# Patient Record
Sex: Female | Born: 1980 | Race: White | Hispanic: No | Marital: Married | Smoking: Current every day smoker
Health system: Southern US, Community
[De-identification: ages and names within clinical notes are randomized; demographics above are authoritative.]

## PROBLEM LIST (undated history)

## (undated) DIAGNOSIS — K819 Cholecystitis, unspecified: Secondary | ICD-10-CM

## (undated) HISTORY — PX: NO PAST SURGERIES: SHX2092

---
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
## 1999-02-11 ENCOUNTER — Observation Stay (HOSPITAL_COMMUNITY): Admission: AD | Admit: 1999-02-11 | Discharge: 1999-02-12 | Payer: Self-pay | Admitting: Obstetrics & Gynecology

## 1999-02-12 ENCOUNTER — Inpatient Hospital Stay (HOSPITAL_COMMUNITY): Admission: AD | Admit: 1999-02-12 | Discharge: 1999-02-12 | Payer: Self-pay | Admitting: Obstetrics and Gynecology

## 1999-02-15 ENCOUNTER — Inpatient Hospital Stay (HOSPITAL_COMMUNITY): Admission: AD | Admit: 1999-02-15 | Discharge: 1999-02-15 | Payer: Self-pay | Admitting: Obstetrics and Gynecology

## 1999-02-20 ENCOUNTER — Inpatient Hospital Stay (HOSPITAL_COMMUNITY): Admission: AD | Admit: 1999-02-20 | Discharge: 1999-02-20 | Payer: Self-pay | Admitting: Obstetrics & Gynecology

## 1999-02-26 ENCOUNTER — Inpatient Hospital Stay (HOSPITAL_COMMUNITY): Admission: AD | Admit: 1999-02-26 | Discharge: 1999-02-28 | Payer: Self-pay | Admitting: Obstetrics and Gynecology

## 1999-08-29 ENCOUNTER — Emergency Department (HOSPITAL_COMMUNITY): Admission: EM | Admit: 1999-08-29 | Discharge: 1999-08-29 | Payer: Self-pay | Admitting: Emergency Medicine

## 1999-12-31 ENCOUNTER — Other Ambulatory Visit: Admission: RE | Admit: 1999-12-31 | Discharge: 1999-12-31 | Payer: Self-pay | Admitting: Obstetrics and Gynecology

## 2001-08-29 ENCOUNTER — Emergency Department (HOSPITAL_COMMUNITY): Admission: EM | Admit: 2001-08-29 | Discharge: 2001-08-29 | Payer: Self-pay | Admitting: Emergency Medicine

## 2001-09-20 ENCOUNTER — Emergency Department (HOSPITAL_COMMUNITY): Admission: EM | Admit: 2001-09-20 | Discharge: 2001-09-20 | Payer: Self-pay | Admitting: *Deleted

## 2001-10-20 ENCOUNTER — Encounter: Payer: Self-pay | Admitting: Family Medicine

## 2001-10-20 ENCOUNTER — Ambulatory Visit (HOSPITAL_COMMUNITY): Admission: RE | Admit: 2001-10-20 | Discharge: 2001-10-20 | Payer: Self-pay | Admitting: Family Medicine

## 2001-12-26 ENCOUNTER — Other Ambulatory Visit: Admission: RE | Admit: 2001-12-26 | Discharge: 2001-12-26 | Payer: Self-pay | Admitting: Obstetrics and Gynecology

## 2002-01-18 ENCOUNTER — Emergency Department (HOSPITAL_COMMUNITY): Admission: EM | Admit: 2002-01-18 | Discharge: 2002-01-18 | Payer: Self-pay | Admitting: Internal Medicine

## 2002-01-19 ENCOUNTER — Inpatient Hospital Stay (HOSPITAL_COMMUNITY): Admission: AD | Admit: 2002-01-19 | Discharge: 2002-01-19 | Payer: Self-pay | Admitting: *Deleted

## 2002-06-11 ENCOUNTER — Emergency Department (HOSPITAL_COMMUNITY): Admission: EM | Admit: 2002-06-11 | Discharge: 2002-06-11 | Payer: Self-pay | Admitting: Emergency Medicine

## 2002-06-26 ENCOUNTER — Emergency Department (HOSPITAL_COMMUNITY): Admission: EM | Admit: 2002-06-26 | Discharge: 2002-06-26 | Payer: Self-pay | Admitting: *Deleted

## 2002-06-26 ENCOUNTER — Encounter: Payer: Self-pay | Admitting: *Deleted

## 2003-08-02 ENCOUNTER — Ambulatory Visit (HOSPITAL_COMMUNITY): Admission: RE | Admit: 2003-08-02 | Discharge: 2003-08-02 | Payer: Self-pay | Admitting: Family Medicine

## 2003-10-18 ENCOUNTER — Ambulatory Visit (HOSPITAL_COMMUNITY): Admission: RE | Admit: 2003-10-18 | Discharge: 2003-10-18 | Payer: Self-pay | Admitting: Gynecology

## 2004-03-10 ENCOUNTER — Inpatient Hospital Stay (HOSPITAL_COMMUNITY): Admission: AD | Admit: 2004-03-10 | Discharge: 2004-03-12 | Payer: Self-pay | Admitting: Gynecology

## 2004-06-30 ENCOUNTER — Emergency Department: Payer: Self-pay | Admitting: Emergency Medicine

## 2005-06-07 ENCOUNTER — Emergency Department: Payer: Self-pay | Admitting: Emergency Medicine

## 2006-08-29 ENCOUNTER — Encounter: Payer: Self-pay | Admitting: Maternal & Fetal Medicine

## 2006-11-03 ENCOUNTER — Observation Stay: Payer: Self-pay | Admitting: Obstetrics and Gynecology

## 2006-11-07 ENCOUNTER — Encounter: Payer: Self-pay | Admitting: Maternal & Fetal Medicine

## 2006-12-29 ENCOUNTER — Encounter: Payer: Self-pay | Admitting: Maternal & Fetal Medicine

## 2007-01-02 ENCOUNTER — Observation Stay: Payer: Self-pay

## 2007-01-02 ENCOUNTER — Encounter: Payer: Self-pay | Admitting: Maternal & Fetal Medicine

## 2007-01-05 ENCOUNTER — Encounter: Payer: Self-pay | Admitting: Maternal & Fetal Medicine

## 2007-01-11 ENCOUNTER — Inpatient Hospital Stay: Payer: Self-pay

## 2007-04-23 ENCOUNTER — Emergency Department: Payer: Self-pay | Admitting: Emergency Medicine

## 2008-09-23 ENCOUNTER — Ambulatory Visit (HOSPITAL_COMMUNITY): Admission: RE | Admit: 2008-09-23 | Discharge: 2008-09-23 | Payer: Self-pay | Admitting: Obstetrics & Gynecology

## 2008-10-23 ENCOUNTER — Inpatient Hospital Stay (HOSPITAL_COMMUNITY): Admission: AD | Admit: 2008-10-23 | Discharge: 2008-10-23 | Payer: Self-pay | Admitting: Obstetrics

## 2008-10-24 ENCOUNTER — Inpatient Hospital Stay (HOSPITAL_COMMUNITY): Admission: AD | Admit: 2008-10-24 | Discharge: 2008-10-25 | Payer: Self-pay | Admitting: Obstetrics

## 2009-04-15 ENCOUNTER — Emergency Department (HOSPITAL_COMMUNITY): Admission: EM | Admit: 2009-04-15 | Discharge: 2009-04-15 | Payer: Self-pay | Admitting: Emergency Medicine

## 2010-01-01 ENCOUNTER — Ambulatory Visit: Payer: Self-pay | Admitting: Nurse Practitioner

## 2010-01-01 ENCOUNTER — Inpatient Hospital Stay (HOSPITAL_COMMUNITY): Admission: AD | Admit: 2010-01-01 | Discharge: 2010-01-01 | Payer: Self-pay | Admitting: Obstetrics

## 2010-04-02 ENCOUNTER — Emergency Department (HOSPITAL_COMMUNITY): Admission: EM | Admit: 2010-04-02 | Discharge: 2010-04-02 | Payer: Self-pay | Admitting: Emergency Medicine

## 2010-05-19 IMAGING — CT CT PELVIS W/ CM
2 of 4 series · 17 of 46 positions shown, 19 images · IV contrast (APPLIED)
Comparison: None

CT ABDOMEN

CLINICAL DATA: Right lower quadrant pain.

CT ABDOMEN AND PELVIS WITH CONTRAST
TECHNIQUE: Multidetector CT imaging of the abdomen and pelvis was
performed using the standard protocol following bolus
administration of intravenous contrast.
Contrast: 80 ml Kmnipaque-DGG and oral contrast

[Series 2: abd/pelv with 5.0 b31f st · axial · 0.60mm/px · z∈[-423,-43]mm · 14 of 84 slices shown, 16 images]
[im 4/84  soft-tissue]
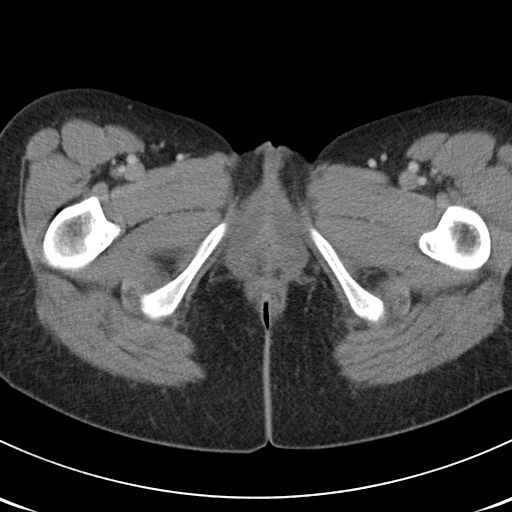
[im 4/84  bone]
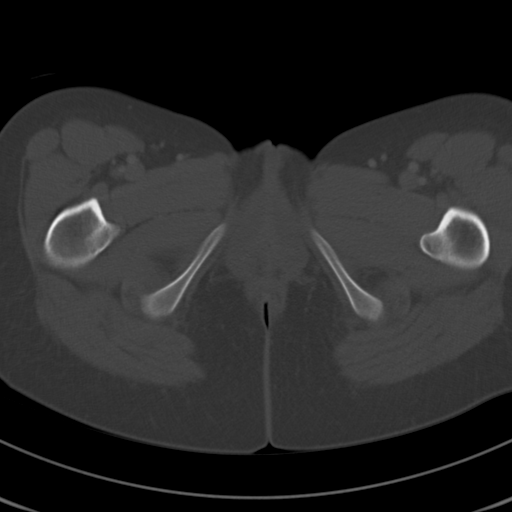
[im 11/84  soft-tissue]
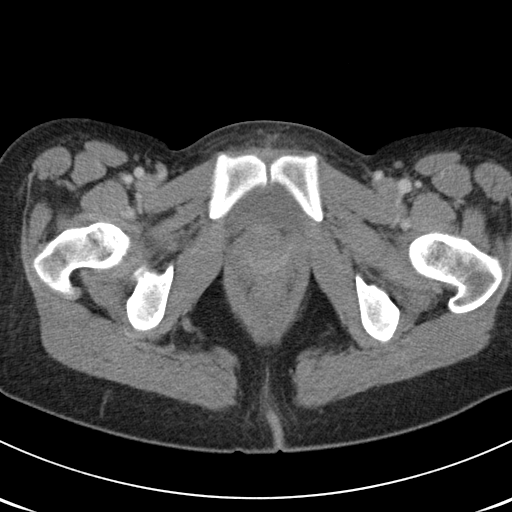
[im 18/84  soft-tissue]
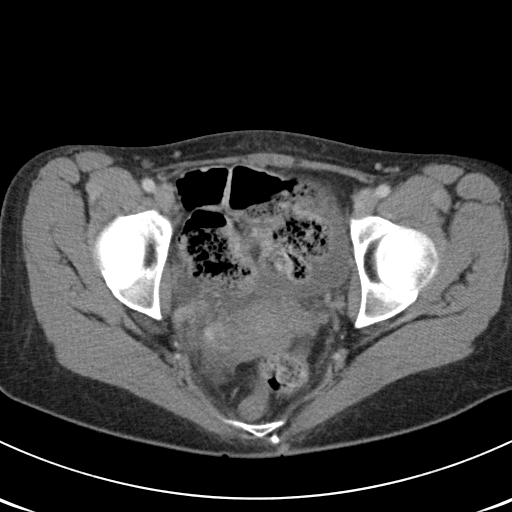
[im 21/84  soft-tissue]
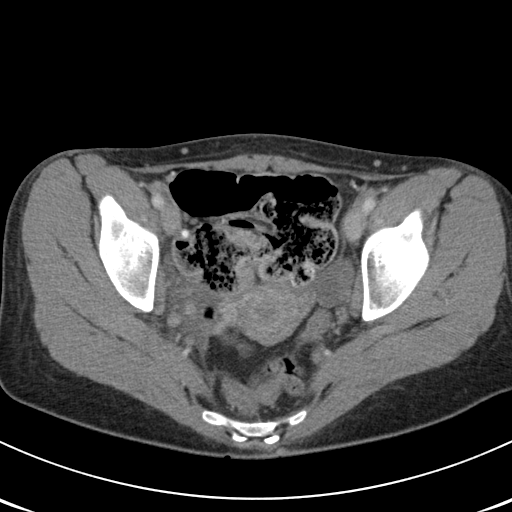
[im 28/84  soft-tissue]
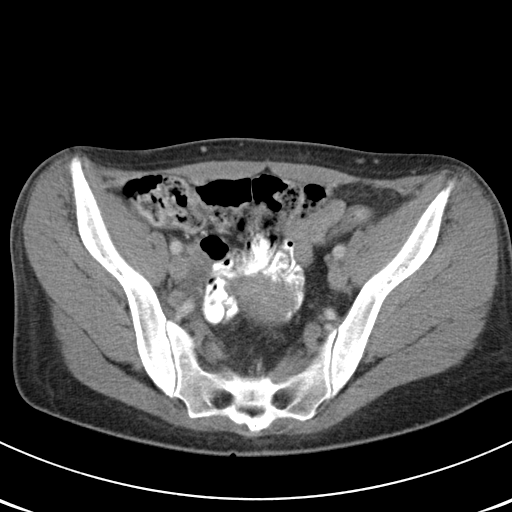
[im 35/84  soft-tissue]
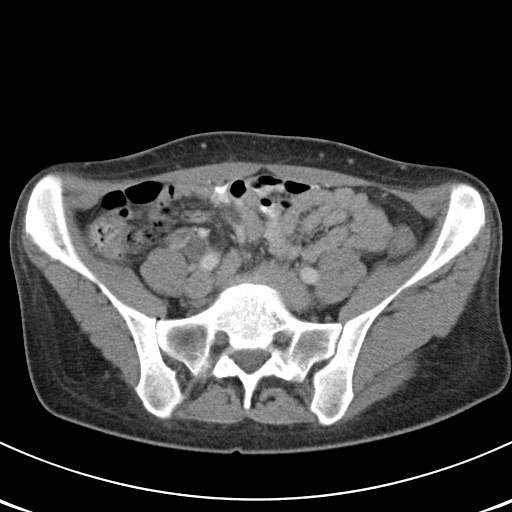
[im 39/84  soft-tissue]
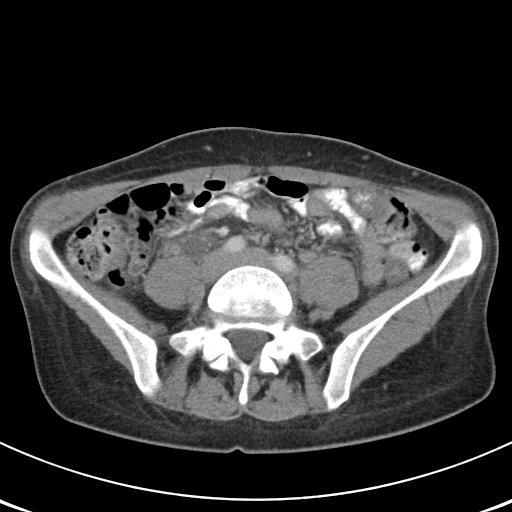
[im 45/84  soft-tissue]
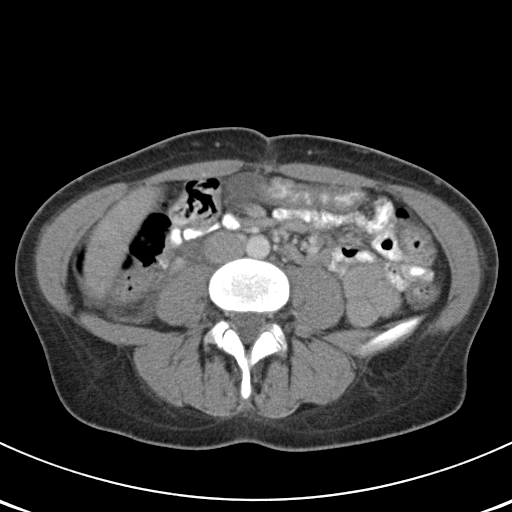
[im 49/84  soft-tissue]
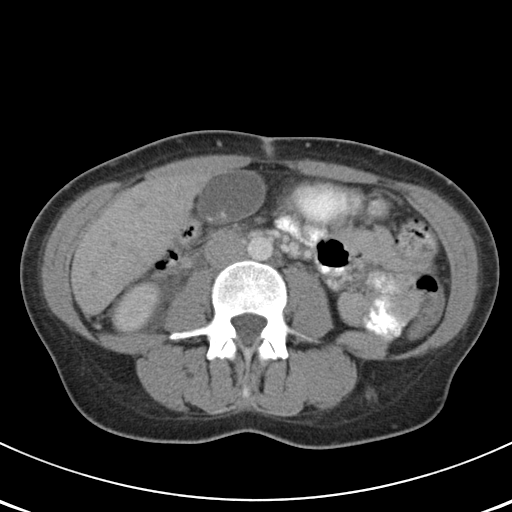
[im 49/84  bone]
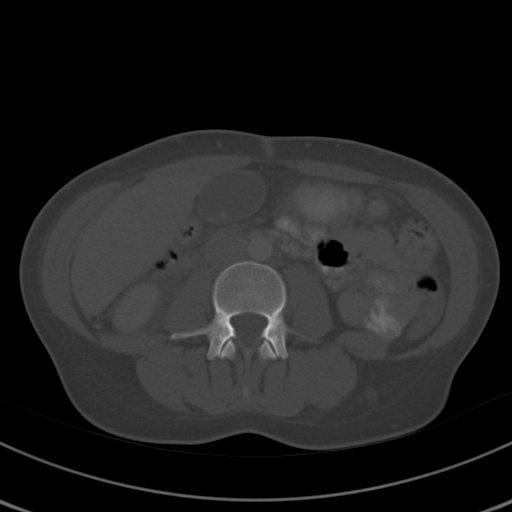
[im 56/84  soft-tissue]
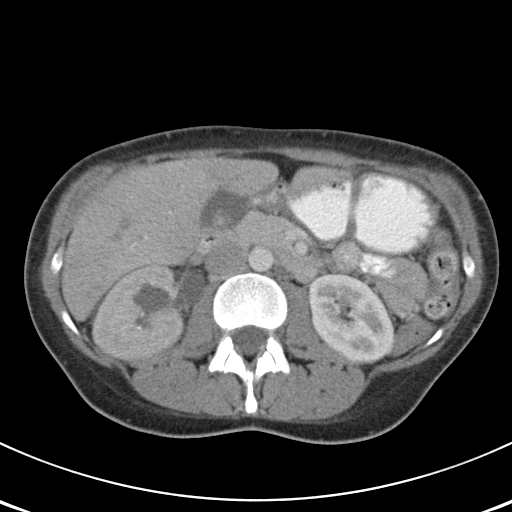
[im 63/84  soft-tissue]
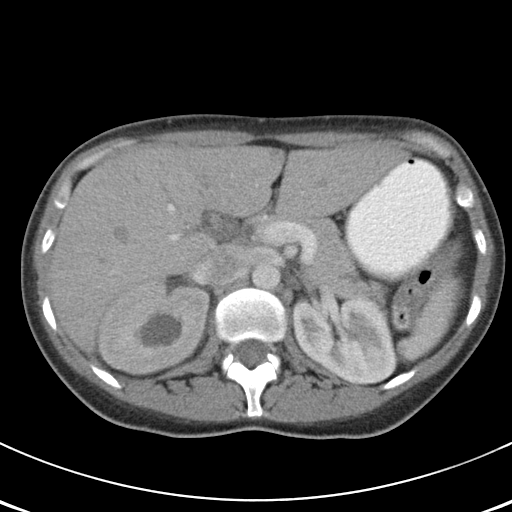
[im 66/84  soft-tissue]
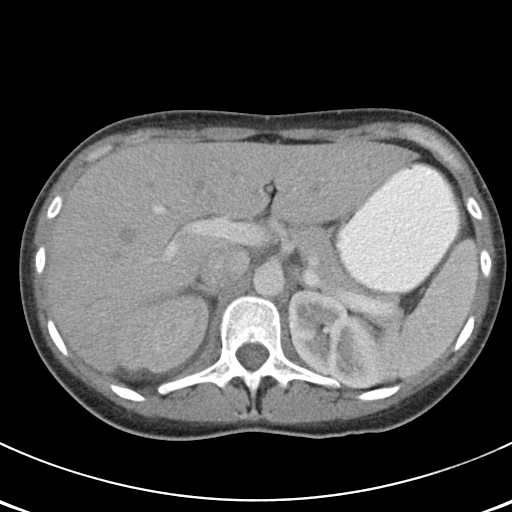
[im 73/84  soft-tissue]
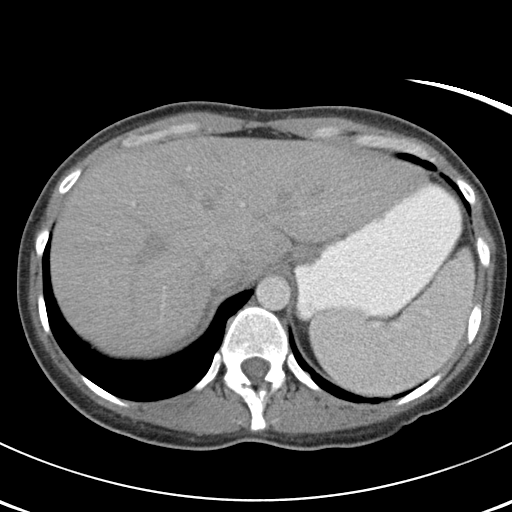
[im 80/84  soft-tissue]
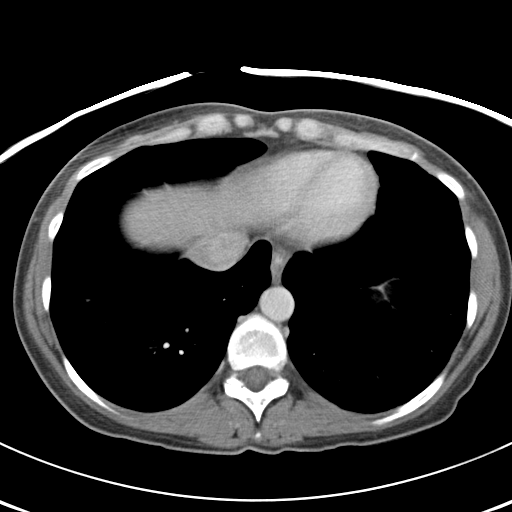

[Series 602: coronal a/p · coronal · 0.82mm/px · 3 of 55 slices shown]
[im 19/55  soft-tissue]
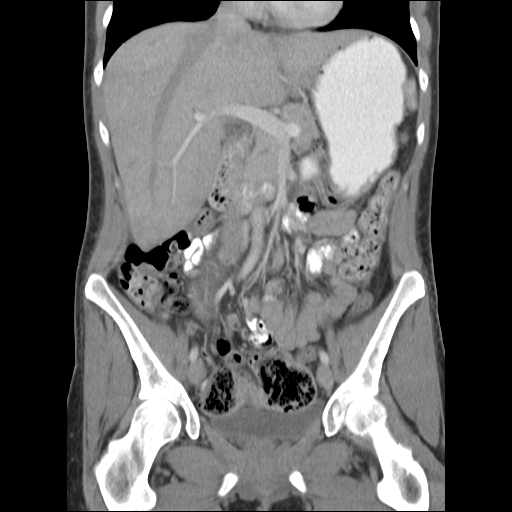
[im 25/55  soft-tissue]
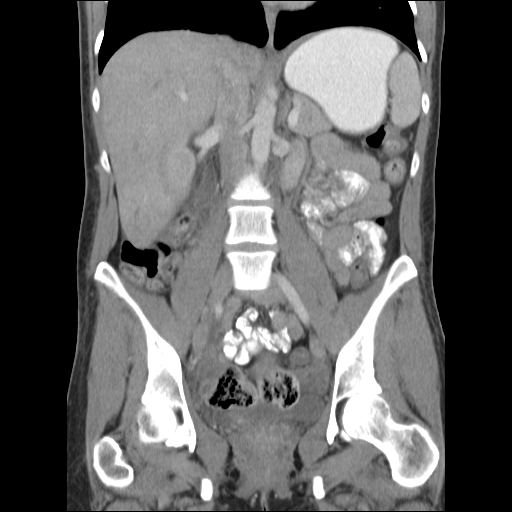
[im 31/55  soft-tissue]
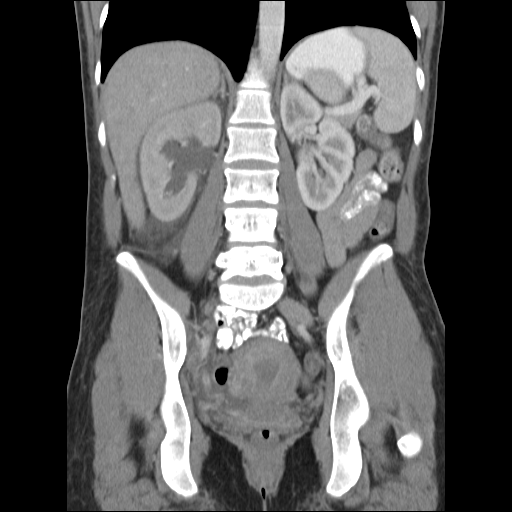

[17 of 46 positions shown; findings below may reference images not displayed]

FINDINGS: Moderate right hydronephrosis and ureterectasis is seen
with delayed contrast excretion by the right kidney.  Several tiny
1-2 mm right intrarenal calculi also noted. No evidence of left-
sided hydronephrosis.  No evidence of renal mass.

Small calcified gallstones are noted however there is no evidence
of acute cholecystitis.  No other inflammatory process or abnormal
fluid collections are identified within the abdomen.  The other
abdominal parenchymal organs are normal in appearance, and there is
no evidence of soft tissue masses or lymphadenopathy.
IMPRESSION: 1.  Moderate right hydronephrosis and ureterectasis.  See abnormal
pelvis CT report below.
2.  Right nephrolithiasis and cholelithiasis.

CT PELVIS
FINDINGS: Moderate dilatation of the right ureter is seen.  A
calculus is seen at the right ureterovesicle junction measuring
approximately 3 x 5 mm.

No pelvic soft tissue masses are identified.  There is no evidence
of inflammatory process or abnormal fluid collections within the
pelvis.  No evidence of dilated bowel loops.
IMPRESSION: 3 x 5 mm calculus at the right ureterovesicle junction, causing
moderate right hydroureteronephrosis.

## 2010-06-15 ENCOUNTER — Encounter: Payer: Self-pay | Admitting: Obstetrics & Gynecology

## 2010-08-04 LAB — URINALYSIS, ROUTINE W REFLEX MICROSCOPIC
Hgb urine dipstick: NEGATIVE
Nitrite: NEGATIVE
Protein, ur: NEGATIVE mg/dL
Urobilinogen, UA: 0.2 mg/dL (ref 0.0–1.0)

## 2010-08-04 LAB — URINE CULTURE
Colony Count: NO GROWTH
Culture  Setup Time: 201111110337
Culture: NO GROWTH

## 2010-08-04 LAB — URINE MICROSCOPIC-ADD ON

## 2010-08-04 LAB — POCT PREGNANCY, URINE: Preg Test, Ur: NEGATIVE

## 2010-08-07 LAB — URINALYSIS, ROUTINE W REFLEX MICROSCOPIC
Glucose, UA: NEGATIVE mg/dL
Nitrite: NEGATIVE
Protein, ur: NEGATIVE mg/dL
Specific Gravity, Urine: 1.025 (ref 1.005–1.030)

## 2010-08-26 LAB — COMPREHENSIVE METABOLIC PANEL
CO2: 17 mEq/L — ABNORMAL LOW (ref 19–32)
Calcium: 9.8 mg/dL (ref 8.4–10.5)
Creatinine, Ser: 0.9 mg/dL (ref 0.4–1.2)
GFR calc Af Amer: >60 mL/min (ref >60)
Glucose, Bld: 98 mg/dL (ref 70–99)

## 2010-08-26 LAB — POCT I-STAT, CHEM 8
BUN: 5 mg/dL — ABNORMAL LOW (ref 6–23)
Calcium, Ion: 1.14 mmol/L (ref 1.12–1.32)
Creatinine, Ser: 0.9 mg/dL (ref 0.4–1.2)
Glucose, Bld: 95 mg/dL (ref 70–99)
Sodium: 141 mEq/L (ref 135–145)
TCO2: 16 mmol/L (ref 0–100)

## 2010-08-26 LAB — CBC
HCT: 43.1 % (ref 36.0–46.0)
MCHC: 35 g/dL (ref 30.0–36.0)
MCV: 88.4 fL (ref 78.0–100.0)
RBC: 4.88 MIL/uL (ref 3.87–5.11)

## 2010-08-26 LAB — DIFFERENTIAL
Eosinophils Relative: 0 % (ref 0–5)
Lymphocytes Relative: 10 % — ABNORMAL LOW (ref 12–46)
Monocytes Absolute: 0.6 10*3/uL (ref 0.1–1.0)
Monocytes Relative: 6 % (ref 3–12)
Neutro Abs: 8 10*3/uL — ABNORMAL HIGH (ref 1.7–7.7)
Neutrophils Relative %: 83 % — ABNORMAL HIGH (ref 43–77)

## 2010-08-26 LAB — URINALYSIS, ROUTINE W REFLEX MICROSCOPIC
Protein, ur: NEGATIVE mg/dL
Urobilinogen, UA: 0.2 mg/dL (ref 0.0–1.0)

## 2010-08-26 LAB — URINE MICROSCOPIC-ADD ON

## 2010-08-31 LAB — CBC
HCT: 33 % — ABNORMAL LOW (ref 36.0–46.0)
MCHC: 35.2 g/dL (ref 30.0–36.0)
MCV: 84.8 fL (ref 78.0–100.0)
Platelets: 336 10*3/uL (ref 150–400)
Platelets: 384 10*3/uL (ref 150–400)
RBC: 4.06 MIL/uL (ref 3.87–5.11)
RDW: 14.2 % (ref 11.5–15.5)
WBC: 13 10*3/uL — ABNORMAL HIGH (ref 4.0–10.5)

## 2010-08-31 LAB — GLUCOSE, CAPILLARY: Glucose-Capillary: 66 mg/dL — ABNORMAL LOW (ref 70–99)

## 2014-01-04 ENCOUNTER — Other Ambulatory Visit (HOSPITAL_COMMUNITY)
Admission: RE | Admit: 2014-01-04 | Discharge: 2014-01-04 | Disposition: A | Discharge status: Home / Self Care | Payer: Managed Care, Other (non HMO) | Source: Ambulatory Visit | Attending: Obstetrics and Gynecology | Admitting: Obstetrics and Gynecology

## 2014-01-04 ENCOUNTER — Other Ambulatory Visit: Payer: Self-pay | Admitting: Obstetrics and Gynecology

## 2014-01-04 DIAGNOSIS — Z01419 Encounter for gynecological examination (general) (routine) without abnormal findings: Secondary | ICD-10-CM | POA: Insufficient documentation

## 2014-01-04 DIAGNOSIS — Z1151 Encounter for screening for human papillomavirus (HPV): Secondary | ICD-10-CM | POA: Insufficient documentation

## 2014-01-07 LAB — CYTOLOGY - PAP

## 2016-03-19 ENCOUNTER — Encounter (HOSPITAL_COMMUNITY): Payer: Self-pay | Admitting: *Deleted

## 2016-03-19 ENCOUNTER — Emergency Department (HOSPITAL_COMMUNITY)
Admission: EM | Admit: 2016-03-19 | Discharge: 2016-03-19 | Disposition: A | Discharge status: Home / Self Care | Payer: Managed Care, Other (non HMO) | Attending: Emergency Medicine | Admitting: Emergency Medicine

## 2016-03-19 DIAGNOSIS — Z79899 Other long term (current) drug therapy: Secondary | ICD-10-CM | POA: Insufficient documentation

## 2016-03-19 DIAGNOSIS — F17210 Nicotine dependence, cigarettes, uncomplicated: Secondary | ICD-10-CM | POA: Insufficient documentation

## 2016-03-19 DIAGNOSIS — K529 Noninfective gastroenteritis and colitis, unspecified: Secondary | ICD-10-CM | POA: Insufficient documentation

## 2016-03-19 DIAGNOSIS — R1084 Generalized abdominal pain: Secondary | ICD-10-CM

## 2016-03-19 DIAGNOSIS — R112 Nausea with vomiting, unspecified: Secondary | ICD-10-CM

## 2016-03-19 LAB — COMPREHENSIVE METABOLIC PANEL
ALBUMIN: 4.5 g/dL (ref 3.5–5.0)
ALT: 15 U/L (ref 14–54)
AST: 25 U/L (ref 15–41)
Alkaline Phosphatase: 46 U/L (ref 38–126)
Anion gap: 9 (ref 5–15)
BILIRUBIN TOTAL: 1.2 mg/dL (ref 0.3–1.2)
BUN: 11 mg/dL (ref 6–20)
CHLORIDE: 106 mmol/L (ref 101–111)
CO2: 25 mmol/L (ref 22–32)
CREATININE: 0.57 mg/dL (ref 0.44–1.00)
Calcium: 9.4 mg/dL (ref 8.9–10.3)
GFR calc Af Amer: >60 mL/min (ref >60)
GFR calc non Af Amer: >60 mL/min (ref >60)
GLUCOSE: 119 mg/dL — AB (ref 65–99)
POTASSIUM: 4 mmol/L (ref 3.5–5.1)
Sodium: 140 mmol/L (ref 135–145)
TOTAL PROTEIN: 7.4 g/dL (ref 6.5–8.1)

## 2016-03-19 LAB — URINALYSIS, ROUTINE W REFLEX MICROSCOPIC
BILIRUBIN URINE: NEGATIVE
GLUCOSE, UA: NEGATIVE mg/dL
HGB URINE DIPSTICK: NEGATIVE
KETONES UR: 40 mg/dL — AB
Leukocytes, UA: NEGATIVE
Nitrite: NEGATIVE
PH: 6.5 (ref 5.0–8.0)
Protein, ur: NEGATIVE mg/dL
Specific Gravity, Urine: 1.027 (ref 1.005–1.030)

## 2016-03-19 LAB — CBC
HEMATOCRIT: 44.6 % (ref 36.0–46.0)
Hemoglobin: 15.6 g/dL — ABNORMAL HIGH (ref 12.0–15.0)
MCH: 30.8 pg (ref 26.0–34.0)
MCHC: 35 g/dL (ref 30.0–36.0)
MCV: 88.1 fL (ref 78.0–100.0)
PLATELETS: 250 10*3/uL (ref 150–400)
RBC: 5.06 MIL/uL (ref 3.87–5.11)
RDW: 12.5 % (ref 11.5–15.5)
WBC: 8.9 10*3/uL (ref 4.0–10.5)

## 2016-03-19 LAB — I-STAT BETA HCG BLOOD, ED (MC, WL, AP ONLY): I-stat hCG, quantitative: <5 m[IU]/mL (ref <5)

## 2016-03-19 LAB — LIPASE, BLOOD: Lipase: 22 U/L (ref 11–51)

## 2016-03-19 MED ORDER — ONDANSETRON HCL 4 MG/2ML IJ SOLN
4.0000 mg | Freq: Once | INTRAMUSCULAR | Status: AC
  Administered 2016-03-19: 4 mg via INTRAVENOUS
  Filled 2016-03-19: qty 2

## 2016-03-19 MED ORDER — SODIUM CHLORIDE 0.9 % IV BOLUS (SEPSIS)
1000.0000 mL | Freq: Once | INTRAVENOUS | Status: AC
  Administered 2016-03-19: 1000 mL via INTRAVENOUS

## 2016-03-19 MED ORDER — GLYCOPYRROLATE 0.2 MG/ML IJ SOLN
0.1000 mg | Freq: Once | INTRAMUSCULAR | Status: AC
  Administered 2016-03-19: 0.1 mg via INTRAVENOUS
  Filled 2016-03-19: qty 1

## 2016-03-19 MED ORDER — ONDANSETRON HCL 4 MG PO TABS: 4.0000 mg | ORAL_TABLET | Freq: Four times a day (QID) | ORAL | 0 refills | Status: AC

## 2016-03-19 MED ORDER — DICYCLOMINE HCL 20 MG PO TABS: 20.0000 mg | ORAL_TABLET | Freq: Two times a day (BID) | ORAL | 0 refills | Status: AC

## 2016-03-19 MED ORDER — ONDANSETRON 4 MG PO TBDP
4.0000 mg | ORAL_TABLET | Freq: Once | ORAL | Status: AC | PRN
  Administered 2016-03-19: 4 mg via ORAL
  Filled 2016-03-19: qty 1

## 2016-03-19 NOTE — ED Triage Notes (Signed)
Pt reports mid abdominal pain and cramping with nausea and vomiting since early this morning, denies, diarrhea, fever

## 2016-03-19 NOTE — ED Notes (Signed)
Pt called to ask for urine specimen and update vital signs with no answer

## 2016-03-19 NOTE — ED Notes (Signed)
Patient refused to have Iv started at this time. Patient reports that she is currently not having nausea since receiving nausea medicine in triage. Patient stated the nausea medicine made her so sleepy that she missed her turn coming back to the assigned room.

## 2016-03-19 NOTE — Discharge Instructions (Signed)
Clear liquids tonight only. Slowly advance your diet as tolerated.  Bentyl for cramps.  Zofran for nausea.

## 2016-03-19 NOTE — ED Provider Notes (Signed)
[facility]-EMERGENCY DEPT Provider Note   CSN: [id number] Arrival date & time: 03/19/16  1303     History   Chief Complaint Chief Complaint  Patient presents with  . Abdominal Pain  . Emesis    HPI Amanda Brandt is a 35 y.o. female.  Chief complaint diarrhea and abdominal cramping. Patient states she was in her normal state of health last night. She tender. Shortly thereafter started having nausea vomiting and abdominal cramping. Vomited several times during the night at multiple times during the day today. Feels weak and tired. Not frankly dizzy or orthostatic. Continue with abdominal cramping. No diarrhea. No dysuria or GU complaints. No pulmonary complaints or difficulty breathing. No significant past medical history.  HPI  History reviewed. No pertinent past medical history.  There are no active problems to display for this patient.   History reviewed. No pertinent surgical history.  OB History    Gravida Para Term Preterm AB Living   1             SAB TAB Ectopic Multiple Live Births                   Home Medications    Prior to Admission medications   Medication Sig Start Date End Date Taking? Authorizing Provider  ibuprofen (ADVIL,MOTRIN) 200 MG tablet Take 800 mg by mouth every 6 (six) hours as needed for headache or moderate pain.   Yes Historical Provider, MD  Multiple Vitamins-Minerals (MULTIVITAMIN ADULT) TABS Take 1 tablet by mouth daily.   Yes Historical Provider, MD  dicyclomine (BENTYL) 20 MG tablet Take 1 tablet (20 mg total) by mouth 2 (two) times daily. 03/19/16   Rolland Porter, MD  ondansetron (ZOFRAN) 4 MG tablet Take 1 tablet (4 mg total) by mouth every 6 (six) hours. 03/19/16   Rolland Porter, MD    Family History No family history on file.  Social History Social History  Substance Use Topics  . Smoking status: Current Every Day Smoker    Packs/day: 1.00    Types: Cigarettes  . Smokeless tobacco: Never Used  . Alcohol use No     Allergies     Review of patient's allergies indicates no known allergies.   Review of Systems Review of Systems  Constitutional: Negative for appetite change, chills, diaphoresis, fatigue and fever.  HENT: Negative for mouth sores, sore throat and trouble swallowing.   Eyes: Negative for visual disturbance.  Respiratory: Negative for cough, chest tightness, shortness of breath and wheezing.   Cardiovascular: Negative for chest pain.  Gastrointestinal: Positive for abdominal pain, diarrhea, nausea and vomiting. Negative for abdominal distention.  Endocrine: Negative for polydipsia, polyphagia and polyuria.  Genitourinary: Negative for dysuria, frequency and hematuria.  Musculoskeletal: Negative for gait problem.  Skin: Negative for color change, pallor and rash.  Neurological: Negative for dizziness, syncope, light-headedness and headaches.  Hematological: Does not bruise/bleed easily.  Psychiatric/Behavioral: Negative for behavioral problems and confusion.     Physical Exam Updated Vital Signs BP 114/90 (BP Location: Left Arm)   Pulse 77   Temp 98.2 F (36.8 C) (Oral)   Resp 18   Ht 5\' 5"  (1.651 m)   Wt 120 lb (54.4 kg)   LMP 02/23/2016   SpO2 99%   BMI 19.97 kg/m   Physical Exam  Constitutional: She is oriented to person, place, and time. She appears well-developed and well-nourished. No distress.  HENT:  Head: Normocephalic.  Eyes: Conjunctivae are normal. Pupils are equal, round, and reactive  to light. No scleral icterus.  Neck: Normal range of motion. Neck supple. No thyromegaly present.  Cardiovascular: Normal rate and regular rhythm.  Exam reveals no gallop and no friction rub.   No murmur heard. Pulmonary/Chest: Effort normal and breath sounds normal. No respiratory distress. She has no wheezes. She has no rales.  Abdominal: Soft. Bowel sounds are normal. She exhibits no distension. There is no tenderness. There is no rebound.  Benign abdominal exam. Normal active to slightly  hyperactive bowel sounds. No focal tenderness.  Musculoskeletal: Normal range of motion.  Neurological: She is alert and oriented to person, place, and time.  Skin: Skin is warm and dry. No rash noted.  Psychiatric: She has a normal mood and affect. Her behavior is normal.     ED Treatments / Results  Labs (all labs ordered are listed, but only abnormal results are displayed) Labs Reviewed  COMPREHENSIVE METABOLIC PANEL - Abnormal; Notable for the following:       Result Value   Glucose, Bld 119 (*)    All other components within normal limits  CBC - Abnormal; Notable for the following:    Hemoglobin 15.6 (*)    All other components within normal limits  URINALYSIS, ROUTINE W REFLEX MICROSCOPIC (NOT AT [facility]) - Abnormal; Notable for the following:    Color, Urine AMBER (*)    Ketones, ur 40 (*)    All other components within normal limits  LIPASE, BLOOD  I-STAT BETA HCG BLOOD, ED ([facility], [facility], [facility] ONLY)    EKG  EKG Interpretation None       Radiology No results found.  Procedures Procedures (including critical care time)  Medications Ordered in ED Medications  ondansetron (ZOFRAN-ODT) disintegrating tablet 4 mg (4 mg Oral Given 03/19/16 1323)  ondansetron (ZOFRAN) injection 4 mg (4 mg Intravenous Given 03/19/16 1712)  glycopyrrolate (ROBINUL) injection 0.1 mg (0.1 mg Intravenous Given 03/19/16 1707)  sodium chloride 0.9 % bolus 1,000 mL (1,000 mLs Intravenous New Bag/Given 03/19/16 1708)     Initial Impression / Assessment and Plan / ED Course  I have reviewed the triage vital signs and the nursing notes.  Pertinent labs & imaging results that were available during my care of the patient were reviewed by me and considered in my medical decision making (see chart for details).  Clinical Course    Reassuring labs. Given IV fluids, Robinul for cramps, Zofran for nausea. Taking by mouth liquids. Plan discharged home. No focal tenderness to suggest supportive bacterial  infection, appendicitis, diverticulitis. Symptoms consistent with viral process versus food poisoning. Return if worsening symptoms including fever, worsening or localizing pain, bloody stools, other changes.  Final Clinical Impressions(s) / ED Diagnoses   Final diagnoses:  Non-intractable vomiting with nausea, unspecified vomiting type  Generalized abdominal pain  Gastroenteritis    New Prescriptions New Prescriptions   DICYCLOMINE (BENTYL) 20 MG TABLET    Take 1 tablet (20 mg total) by mouth 2 (two) times daily.   ONDANSETRON (ZOFRAN) 4 MG TABLET    Take 1 tablet (4 mg total) by mouth every 6 (six) hours.     Rolland PorterMark Demarko Zeimet, MD 03/19/16 [phone]

## 2018-07-24 ENCOUNTER — Emergency Department (HOSPITAL_COMMUNITY): Payer: Medicaid Other

## 2018-07-24 ENCOUNTER — Encounter (HOSPITAL_COMMUNITY): Payer: Self-pay

## 2018-07-24 ENCOUNTER — Observation Stay (HOSPITAL_COMMUNITY)
Admission: EM | Admit: 2018-07-24 | Discharge: 2018-07-25 | Disposition: A | Discharge status: Home / Self Care | Payer: Medicaid Other | Attending: Surgery | Admitting: Surgery

## 2018-07-24 ENCOUNTER — Other Ambulatory Visit: Payer: Self-pay

## 2018-07-24 DIAGNOSIS — T189XXA Foreign body of alimentary tract, part unspecified, initial encounter: Secondary | ICD-10-CM | POA: Insufficient documentation

## 2018-07-24 DIAGNOSIS — Z9889 Other specified postprocedural states: Secondary | ICD-10-CM

## 2018-07-24 DIAGNOSIS — K8000 Calculus of gallbladder with acute cholecystitis without obstruction: Secondary | ICD-10-CM | POA: Diagnosis present

## 2018-07-24 DIAGNOSIS — Z79899 Other long term (current) drug therapy: Secondary | ICD-10-CM | POA: Diagnosis not present

## 2018-07-24 DIAGNOSIS — F17210 Nicotine dependence, cigarettes, uncomplicated: Secondary | ICD-10-CM | POA: Insufficient documentation

## 2018-07-24 DIAGNOSIS — R52 Pain, unspecified: Secondary | ICD-10-CM | POA: Diagnosis present

## 2018-07-24 DIAGNOSIS — Z419 Encounter for procedure for purposes other than remedying health state, unspecified: Secondary | ICD-10-CM

## 2018-07-24 DIAGNOSIS — Z791 Long term (current) use of non-steroidal anti-inflammatories (NSAID): Secondary | ICD-10-CM | POA: Diagnosis not present

## 2018-07-24 DIAGNOSIS — K8010 Calculus of gallbladder with chronic cholecystitis without obstruction: Secondary | ICD-10-CM | POA: Diagnosis not present

## 2018-07-24 DIAGNOSIS — K828 Other specified diseases of gallbladder: Secondary | ICD-10-CM | POA: Diagnosis not present

## 2018-07-24 DIAGNOSIS — T85898A Other specified complication of other internal prosthetic devices, implants and grafts, initial encounter: Principal | ICD-10-CM | POA: Insufficient documentation

## 2018-07-24 DIAGNOSIS — K819 Cholecystitis, unspecified: Secondary | ICD-10-CM

## 2018-07-24 HISTORY — DX: Cholecystitis, unspecified: K81.9

## 2018-07-24 LAB — CBC WITH DIFFERENTIAL/PLATELET
Abs Immature Granulocytes: 0.07 10*3/uL (ref 0.00–0.07)
BASOS ABS: 0.1 10*3/uL (ref 0.0–0.1)
Basophils Relative: 0 %
Eosinophils Absolute: 0.1 10*3/uL (ref 0.0–0.5)
Eosinophils Relative: 1 %
HEMATOCRIT: 42.8 % (ref 36.0–46.0)
Hemoglobin: 13.5 g/dL (ref 12.0–15.0)
IMMATURE GRANULOCYTES: 1 %
LYMPHS ABS: 1.7 10*3/uL (ref 0.7–4.0)
LYMPHS PCT: 15 %
MCH: 29.1 pg (ref 26.0–34.0)
MCHC: 31.5 g/dL (ref 30.0–36.0)
MCV: 92.2 fL (ref 80.0–100.0)
Monocytes Absolute: 0.9 10*3/uL (ref 0.1–1.0)
Monocytes Relative: 8 %
NEUTROS PCT: 75 %
NRBC: 0 % (ref 0.0–0.2)
Neutro Abs: 8.3 10*3/uL — ABNORMAL HIGH (ref 1.7–7.7)
PLATELETS: 351 10*3/uL (ref 150–400)
RBC: 4.64 MIL/uL (ref 3.87–5.11)
RDW: 12.7 % (ref 11.5–15.5)
WBC: 11.2 10*3/uL — AB (ref 4.0–10.5)

## 2018-07-24 LAB — URINALYSIS, ROUTINE W REFLEX MICROSCOPIC
Bilirubin Urine: NEGATIVE
Glucose, UA: NEGATIVE mg/dL
Hgb urine dipstick: NEGATIVE
Ketones, ur: NEGATIVE mg/dL
Leukocytes,Ua: NEGATIVE
NITRITE: NEGATIVE
PROTEIN: NEGATIVE mg/dL
SPECIFIC GRAVITY, URINE: 1.004 — AB (ref 1.005–1.030)
pH: 6 (ref 5.0–8.0)

## 2018-07-24 LAB — COMPREHENSIVE METABOLIC PANEL
ALBUMIN: 3 g/dL — AB (ref 3.5–5.0)
ALT: 57 U/L — ABNORMAL HIGH (ref 0–44)
ANION GAP: 12 (ref 5–15)
AST: 70 U/L — AB (ref 15–41)
Alkaline Phosphatase: 701 U/L — ABNORMAL HIGH (ref 38–126)
BILIRUBIN TOTAL: 1.4 mg/dL — AB (ref 0.3–1.2)
BUN: <5 mg/dL — ABNORMAL LOW (ref 6–20)
CHLORIDE: 105 mmol/L (ref 98–111)
CO2: 23 mmol/L (ref 22–32)
Calcium: 8.9 mg/dL (ref 8.9–10.3)
Creatinine, Ser: 0.66 mg/dL (ref 0.44–1.00)
GFR calc Af Amer: >60 mL/min (ref >60)
GFR calc non Af Amer: >60 mL/min (ref >60)
Glucose, Bld: 114 mg/dL — ABNORMAL HIGH (ref 70–99)
Potassium: 3.4 mmol/L — ABNORMAL LOW (ref 3.5–5.1)
SODIUM: 140 mmol/L (ref 135–145)
Total Protein: 7 g/dL (ref 6.5–8.1)

## 2018-07-24 LAB — LIPASE, BLOOD: Lipase: 30 U/L (ref 11–51)

## 2018-07-24 MED ORDER — SODIUM CHLORIDE 0.9 % IV SOLN
2.0000 g | Freq: Once | INTRAVENOUS | Status: AC
  Administered 2018-07-24: 2 g via INTRAVENOUS
  Filled 2018-07-24: qty 20

## 2018-07-24 MED ORDER — SODIUM CHLORIDE 0.9 % IV SOLN
2.0000 g | INTRAVENOUS | Status: DC
  Administered 2018-07-25: 2 g via INTRAVENOUS
  Filled 2018-07-24: qty 20

## 2018-07-24 MED ORDER — SODIUM CHLORIDE 0.9 % IV BOLUS
1000.0000 mL | Freq: Once | INTRAVENOUS | Status: AC
  Administered 2018-07-24: 1000 mL via INTRAVENOUS

## 2018-07-24 MED ORDER — MORPHINE SULFATE (PF) 2 MG/ML IV SOLN: 2.0000 mg | INTRAVENOUS | Status: DC | PRN

## 2018-07-24 MED ORDER — MORPHINE SULFATE (PF) 4 MG/ML IV SOLN
4.0000 mg | Freq: Once | INTRAVENOUS | Status: DC
  Filled 2018-07-24: qty 1

## 2018-07-24 MED ORDER — ONDANSETRON HCL 4 MG/2ML IJ SOLN
4.0000 mg | Freq: Once | INTRAMUSCULAR | Status: DC
  Filled 2018-07-24: qty 2

## 2018-07-24 MED ORDER — ONDANSETRON 4 MG PO TBDP: 4.0000 mg | ORAL_TABLET | Freq: Four times a day (QID) | ORAL | Status: DC | PRN

## 2018-07-24 MED ORDER — SODIUM CHLORIDE 0.9 % IV SOLN
INTRAVENOUS | Status: DC
  Administered 2018-07-24 – 2018-07-25 (×2): via INTRAVENOUS

## 2018-07-24 MED ORDER — ONDANSETRON HCL 4 MG/2ML IJ SOLN: 4.0000 mg | Freq: Four times a day (QID) | INTRAMUSCULAR | Status: DC | PRN

## 2018-07-24 NOTE — Anesthesia Preprocedure Evaluation (Addendum)
Anesthesia Evaluation  Patient identified by MRN, date of birth, ID band Patient awake    Reviewed: Allergy & Precautions, NPO status , Patient's Chart, lab work & pertinent test results  History of Anesthesia Complications Negative for: history of anesthetic complications  Airway Mallampati: II  TM Distance: >3 FB Neck ROM: Full    Dental  (+) Dental Advisory Given, Chipped,    Pulmonary Current Smoker,    Pulmonary exam normal breath sounds clear to auscultation       Cardiovascular negative cardio ROS Normal cardiovascular exam Rhythm:Regular Rate:Normal     Neuro/Psych negative neurological ROS     GI/Hepatic negative GI ROS, Neg liver ROS, cholecystitis   Endo/Other  negative endocrine ROS  Renal/GU negative Renal ROS     Musculoskeletal negative musculoskeletal ROS (+)   Abdominal   Peds  Hematology negative hematology ROS (+)   Anesthesia Other Findings Day of surgery medications reviewed with the patient.  Reproductive/Obstetrics                            Anesthesia Physical Anesthesia Plan  ASA: II  Anesthesia Plan: General   Post-op Pain Management:    Induction: Intravenous, Rapid sequence and Cricoid pressure planned  PONV Risk Score and Plan: 4 or greater and Treatment may vary due to age or medical condition, Ondansetron, Dexamethasone, Midazolam and Scopolamine patch - Pre-op  Airway Management Planned: Oral ETT  Additional Equipment:   Intra-op Plan:   Post-operative Plan: Extubation in OR  Informed Consent: I have reviewed the patients History and Physical, chart, labs and discussed the procedure including the risks, benefits and alternatives for the proposed anesthesia with the patient or authorized representative who has indicated his/her understanding and acceptance.     Dental advisory given and Dental Advisory Given  Plan Discussed with:  CRNA  Anesthesia Plan Comments: (Pt refuses urine pregnancy test, states "my husband has had a vasectomy, I don't need that".)     Anesthesia Quick Evaluation

## 2018-07-24 NOTE — Progress Notes (Signed)
Pt very anxious but cooperative, refuses food tray, and will not have SCDs in room because she states we are charging her for them. She states that she is ready to "check out."

## 2018-07-24 NOTE — Progress Notes (Signed)
Pt tearful and upset that she can only have clears this p.m.Marland KitchenStating that she is going home after surgery tomorrow. Very demanding

## 2018-07-24 NOTE — ED Notes (Signed)
ED TO INPATIENT HANDOFF REPORT  ED Nurse Name and Phone #: Armond Hang [phone]  S Name/Age/Gender Amanda Brandt 38 y.o. female Room/Bed: 046C/046C  Code Status   Code Status: Not on file {Patient oriented to: Home/SNF/Other Home  Is this baseline? Yes   Triage Complete: Triage complete  Chief Complaint Gall Bladder problems  Triage Note Per pt, she has had abdominal pain after eating for 1 week. Getting progressively worse.   Allergies No Known Allergies  Level of Care/Admitting Diagnosis ED Disposition    ED Disposition Condition Comment   Admit  Hospital Area: [facility] [[id number]]  Level of Care: Med-Surg [16]  Diagnosis: Acute cholecystitis due to biliary calculus [2023343]  Admitting Physician: CCS, MD [[id number]]  Attending Physician: CCS, MD [[id number]]  Bed request comments: 6N  PT Class (Do Not Modify): Observation [104]  PT Acc Code (Do Not Modify): Observation [10022]       B Medical/Surgery History No past medical history on file. No past surgical history on file.   A IV Location/Drains/Wounds Patient Lines/Drains/Airways Status   Active Line/Drains/Airways    Name:   Placement date:   Placement time:   Site:   Days:   Peripheral IV 07/24/18 Right Antecubital   07/24/18    1248    Antecubital   less than 1   Peripheral IV 07/24/18 Right Antecubital   07/24/18    1358    Antecubital   less than 1          Intake/Output Last 24 hours  Intake/Output Summary (Last 24 hours) at 07/24/2018 1509 Last data filed at 07/24/2018 1508 Gross per 24 hour  Intake 50 ml  Output -  Net 50 ml    Labs/Imaging Results for orders placed or performed during the hospital encounter of 07/24/18 (from the past 48 hour(s))  CBC with Differential     Status: Abnormal   Collection Time: 07/24/18 12:12 PM  Result Value Ref Range   WBC 11.2 (H) 4.0 - 10.5 K/uL   RBC 4.64 3.87 - 5.11 MIL/uL   Hemoglobin 13.5 12.0 - 15.0 g/dL   HCT 56.8 61.6 - 83.7 %   MCV  92.2 80.0 - 100.0 fL   MCH 29.1 26.0 - 34.0 pg   MCHC 31.5 30.0 - 36.0 g/dL   RDW 29.0 21.1 - 15.5 %   Platelets 351 150 - 400 K/uL   nRBC 0.0 0.0 - 0.2 %   Neutrophils Relative % 75 %   Neutro Abs 8.3 (H) 1.7 - 7.7 K/uL   Lymphocytes Relative 15 %   Lymphs Abs 1.7 0.7 - 4.0 K/uL   Monocytes Relative 8 %   Monocytes Absolute 0.9 0.1 - 1.0 K/uL   Eosinophils Relative 1 %   Eosinophils Absolute 0.1 0.0 - 0.5 K/uL   Basophils Relative 0 %   Basophils Absolute 0.1 0.0 - 0.1 K/uL   Immature Granulocytes 1 %   Abs Immature Granulocytes 0.07 0.00 - 0.07 K/uL    Comment: Performed at [facility], [address] [place], [place] [address]  Comprehensive metabolic panel     Status: Abnormal   Collection Time: 07/24/18 12:12 PM  Result Value Ref Range   Sodium 140 135 - 145 mmol/L   Potassium 3.4 (L) 3.5 - 5.1 mmol/L   Chloride 105 98 - 111 mmol/L   CO2 23 22 - 32 mmol/L   Glucose, Bld 114 (H) 70 - 99 mg/dL   Brandt <5 (L) 6 -  20 mg/dL   Creatinine, Ser 4.09 0.44 - 1.00 mg/dL   Calcium 8.9 8.9 - 81.1 mg/dL   Total Protein 7.0 6.5 - 8.1 g/dL   Albumin 3.0 (L) 3.5 - 5.0 g/dL   AST 70 (H) 15 - 41 U/L   ALT 57 (H) 0 - 44 U/L   Alkaline Phosphatase 701 (H) 38 - 126 U/L   Total Bilirubin 1.4 (H) 0.3 - 1.2 mg/dL   GFR calc non Af Amer >60 >60 mL/min   GFR calc Af Amer >60 >60 mL/min   Anion gap 12 5 - 15    Comment: Performed at [facility], [address] [place], [place] [address]  Lipase, blood     Status: None   Collection Time: 07/24/18 12:12 PM  Result Value Ref Range   Lipase 30 11 - 51 U/L    Comment: Performed at [facility], [address] [place], [place] [address]  Urinalysis, Routine w reflex microscopic     Status: Abnormal   Collection Time: 07/24/18 12:28 PM  Result Value Ref Range   Color, Urine YELLOW YELLOW   APPearance CLEAR CLEAR   Specific Gravity, Urine 1.004 (L) 1.005 - 1.030   pH 6.0 5.0 - 8.0   Glucose, UA NEGATIVE NEGATIVE mg/dL   Hgb  urine dipstick NEGATIVE NEGATIVE   Bilirubin Urine NEGATIVE NEGATIVE   Ketones, ur NEGATIVE NEGATIVE mg/dL   Protein, ur NEGATIVE NEGATIVE mg/dL   Nitrite NEGATIVE NEGATIVE   Leukocytes,Ua NEGATIVE NEGATIVE    Comment: Performed at [facility], [address], [place], [place] [address]   Dg Abdomen 1 View  Result Date: 07/24/2018 CLINICAL DATA:  Abdominal pain 3-4 weeks.  Common bile duct stent. EXAM: ABDOMEN - 1 VIEW COMPARISON:  Ultrasound 07/24/2018, ERCP 06/30/2016 and CT 04/15/2009 FINDINGS: Bowel gas pattern is nonobstructive with mild fecal retention throughout the colon. 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 Remainder of the exam is unchanged. IMPRESSION: Nonobstructive bowel gas pattern. Common bile duct stent unchanged. Electronically Signed   By: Elberta Fortis M.D.   On: 07/24/2018 14:21   US Abdomen Limited Ruq  Result Date: 07/24/2018 CLINICAL DATA:  Right upper quadrant pain for several weeks EXAM: ULTRASOUND ABDOMEN LIMITED RIGHT UPPER QUADRANT COMPARISON:  06/29/2016 FINDINGS: Gallbladder: Gallbladder is decompressed with multiple gallstones within. Wall thickening measures 7.7 mm although a negative sonographic Murphy's sign is noted and overall decompression accentuates wall thickness. Common bile duct: Diameter: 2.6 mm. Liver: No focal lesion identified. Within normal limits in parenchymal echogenicity. Portal vein is patent on color Doppler imaging with normal direction of blood flow towards the liver. IMPRESSION: Multiple gallstones in a contracted gallbladder. Negative sonographic Eulah Pont sign is seen. No ductal dilatation is noted. Electronically Signed   By: Alcide Clever M.D.   On: 07/24/2018 13:32    Pending Labs [employer] (From admission, onward)    Start     Ordered   Signed and Held  HIV antibody (Routine Testing)  Once,   R     Signed and Held   Signed and Held  Comprehensive metabolic panel  Tomorrow morning,   R      Signed and Held   Signed and Held  CBC  Tomorrow morning,   R     Signed and Held          Vitals/Pain Today's Vitals   07/24/18 1205 07/24/18 1215 07/24/18 1229  BP: 114/77 104/75  Pulse: 89 88   Resp: 14    Temp: 98.1 F (36.7 C)    TempSrc: Oral    SpO2: 100% 100%   PainSc:   6     Isolation Precautions No active isolations  Medications Medications  ondansetron (ZOFRAN) injection 4 mg (has no administration in time range)  morphine 4 MG/ML injection 4 mg (has no administration in time range)  sodium chloride 0.9 % bolus 1,000 mL (1,000 mLs Intravenous New Bag/Given 07/24/18 1249)  cefTRIAXone (ROCEPHIN) 2 g in sodium chloride 0.9 % 100 mL IVPB (0 g Intravenous Stopped 07/24/18 1508)    Mobility walks Low fall risk   Focused Assessments   R Recommendations: See Admitting Provider Note  Report given to:   Additional Notes: n/a

## 2018-07-24 NOTE — ED Provider Notes (Signed)
[facility] EMERGENCY DEPARTMENT Provider Note   CSN: [id number] Arrival date & time: 07/24/18  1152    History   Chief Complaint Chief Complaint  Patient presents with  . Abdominal Pain    HPI Amanda Brandt is a 38 y.o. female.     Pt presents to the ED today with RUQ abd pain.  She said she was admitted to [facility] 2 years ago for gallstones and obstructive jaundice.  They removed the stones and placed a stent.  Pt was advised to have her gallbladder out during that admission, but she refused.  She never went back to follow up.  For the last few days, it's been hurting a lot when she eats.       No past medical history on file.  There are no active problems to display for this patient.   No past surgical history on file.   OB History    Gravida  1   Para      Term      Preterm      AB      Living        SAB      TAB      Ectopic      Multiple      Live Births               Home Medications    Prior to Admission medications   Medication Sig Start Date End Date Taking? Authorizing Provider  dicyclomine (BENTYL) 20 MG tablet Take 1 tablet (20 mg total) by mouth 2 (two) times daily. 03/19/16   Rolland Porter, MD  ibuprofen (ADVIL,MOTRIN) 200 MG tablet Take 800 mg by mouth every 6 (six) hours as needed for headache or moderate pain.    [provider]  Multiple Vitamins-Minerals (MULTIVITAMIN ADULT) TABS Take 1 tablet by mouth daily.    [provider]  ondansetron (ZOFRAN) 4 MG tablet Take 1 tablet (4 mg total) by mouth every 6 (six) hours. 03/19/16   Rolland Porter, MD    Family History No family history on file.  Social History Social History   Tobacco Use  . Smoking status: Current Every Day Smoker    Packs/day: 1.00    Types: Cigarettes  . Smokeless tobacco: Never Used  Substance Use Topics  . Alcohol use: No  . Drug use: No     Allergies   Patient has no known allergies.   Review of  Systems Review of Systems  Gastrointestinal: Positive for abdominal pain.  All other systems reviewed and are negative.    Physical Exam Updated Vital Signs BP 104/75   Pulse 88   Temp 98.1 F (36.7 C) (Oral)   Resp 14   LMP 07/03/2018 (Within Days) Comment: Pt agreed to sign preg test waiver  SpO2 100%   Breastfeeding Unknown   Physical Exam Vitals signs and nursing note reviewed.  Constitutional:      Appearance: She is well-developed.  HENT:     Head: Normocephalic and atraumatic.     Mouth/Throat:     Mouth: Mucous membranes are moist.     Pharynx: Oropharynx is clear.  Eyes:     Extraocular Movements: Extraocular movements intact.     Pupils: Pupils are equal, round, and reactive to light.  Cardiovascular:     Rate and Rhythm: Normal rate and regular rhythm.  Pulmonary:     Effort: Pulmonary effort is normal.     Breath sounds:  Normal breath sounds.  Abdominal:     General: Abdomen is flat. Bowel sounds are normal.     Palpations: Abdomen is soft.     Tenderness: There is abdominal tenderness in the right upper quadrant.  Skin:    General: Skin is warm.  Neurological:     General: No focal deficit present.     Mental Status: She is alert and oriented to person, place, and time.  Psychiatric:        Mood and Affect: Mood normal.        Behavior: Behavior normal.      ED Treatments / Results  Labs (all labs ordered are listed, but only abnormal results are displayed) Labs Reviewed  CBC WITH DIFFERENTIAL/PLATELET - Abnormal; Notable for the following components:      Result Value   WBC 11.2 (*)    Neutro Abs 8.3 (*)    All other components within normal limits  COMPREHENSIVE METABOLIC PANEL - Abnormal; Notable for the following components:   Potassium 3.4 (*)    Glucose, Bld 114 (*)    BUN <5 (*)    Albumin 3.0 (*)    AST 70 (*)    ALT 57 (*)    Alkaline Phosphatase 701 (*)    Total Bilirubin 1.4 (*)    All other components within normal limits   URINALYSIS, ROUTINE W REFLEX MICROSCOPIC - Abnormal; Notable for the following components:   Specific Gravity, Urine 1.004 (*)    All other components within normal limits  LIPASE, BLOOD    EKG None  Radiology Dg Abdomen 1 View  Result Date: 07/24/2018 CLINICAL DATA:  Abdominal pain 3-4 weeks.  Common bile duct stent. EXAM: ABDOMEN - 1 VIEW COMPARISON:  Ultrasound 07/24/2018, ERCP 06/30/2016 and CT 04/15/2009 FINDINGS: Bowel gas pattern is nonobstructive with mild fecal retention throughout the colon. 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 Remainder of the exam is unchanged. IMPRESSION: Nonobstructive bowel gas pattern. Common bile duct stent unchanged. Electronically Signed   By: Elberta Fortis M.D.   On: 07/24/2018 14:21   US Abdomen Limited Ruq  Result Date: 07/24/2018 CLINICAL DATA:  Right upper quadrant pain for several weeks EXAM: ULTRASOUND ABDOMEN LIMITED RIGHT UPPER QUADRANT COMPARISON:  06/29/2016 FINDINGS: Gallbladder: Gallbladder is decompressed with multiple gallstones within. Wall thickening measures 7.7 mm although a negative sonographic Murphy's sign is noted and overall decompression accentuates wall thickness. Common bile duct: Diameter: 2.6 mm. Liver: No focal lesion identified. Within normal limits in parenchymal echogenicity. Portal vein is patent on color Doppler imaging with normal direction of blood flow towards the liver. IMPRESSION: Multiple gallstones in a contracted gallbladder. Negative sonographic Eulah Pont sign is seen. No ductal dilatation is noted. Electronically Signed   By: Alcide Clever M.D.   On: 07/24/2018 13:32    Procedures Procedures (including critical care time)  Medications Ordered in ED Medications  ondansetron (ZOFRAN) injection 4 mg (has no administration in time range)  morphine 4 MG/ML injection 4 mg (has no administration in time range)  cefTRIAXone (ROCEPHIN) 2 g in sodium chloride 0.9 % 100 mL IVPB (2 g  Intravenous New Bag/Given 07/24/18 1426)  sodium chloride 0.9 % bolus 1,000 mL (1,000 mLs Intravenous New Bag/Given 07/24/18 1249)     Initial Impression / Assessment and Plan / ED Course  I have reviewed the triage vital signs and the nursing notes.  Pertinent labs & imaging results that were available during my care of the patient were  reviewed by me and considered in my medical decision making (see chart for details).       Pt d/w surgery who will see pt in ED.  Dr. Harlon Flor will admit.  Pt given rocephin in ED.  Stent is still in place, so they will contact GI.     Final Clinical Impressions(s) / ED Diagnoses   Final diagnoses:  Pain  Calculus of gallbladder with acute cholecystitis without obstruction  History of biliary stent insertion    ED Discharge Orders    None       Jacalyn Lefevre, MD 07/24/18 1444

## 2018-07-24 NOTE — H&P (Signed)
Amanda Brandt is an 38 y.o. female.   Chief Complaint: RUQ abdominal pain HPI: This is a 38 yo female who presents with a one week history of RUQ abdominal pain, worse with eating any type of food.  She reports no nausea or vomiting, no diarrhea, no fever.  The pain radiates to her back.    In 2018, she was hospitalized at [facility] with cholecholithiasis and jaundice.  She underwent ERCP with stent placement and was discharged with instructions to follow-up with GI for stent removal and with General Surgery for cholecystectomy.  She failed to do either.    No past medical history on file.  No past surgical history on file.  No family history on file. Social History:  reports that she has been smoking cigarettes. She has been smoking about 1.00 pack per day. She has never used smokeless tobacco. She reports that she does not drink alcohol or use drugs.  Allergies: No Known Allergies  Prior to Admission medications   Medication Sig Start Date End Date Taking? Authorizing Provider  dicyclomine (BENTYL) 20 MG tablet Take 1 tablet (20 mg total) by mouth 2 (two) times daily. 03/19/16   Rolland Porter, MD  ibuprofen (ADVIL,MOTRIN) 200 MG tablet Take 800 mg by mouth every 6 (six) hours as needed for headache or moderate pain.    [provider]  Multiple Vitamins-Minerals (MULTIVITAMIN ADULT) TABS Take 1 tablet by mouth daily.    [provider]  ondansetron (ZOFRAN) 4 MG tablet Take 1 tablet (4 mg total) by mouth every 6 (six) hours. 03/19/16   Rolland Porter, MD     Results for orders placed or performed during the hospital encounter of 07/24/18 (from the past 48 hour(s))  CBC with Differential     Status: Abnormal   Collection Time: 07/24/18 12:12 PM  Result Value Ref Range   WBC 11.2 (H) 4.0 - 10.5 K/uL   RBC 4.64 3.87 - 5.11 MIL/uL   Hemoglobin 13.5 12.0 - 15.0 g/dL   HCT 80.0 34.9 - 17.9 %   MCV 92.2 80.0 - 100.0 fL   MCH 29.1 26.0 - 34.0 pg   MCHC 31.5 30.0  - 36.0 g/dL   RDW 15.0 56.9 - 79.4 %   Platelets 351 150 - 400 K/uL   nRBC 0.0 0.0 - 0.2 %   Neutrophils Relative % 75 %   Neutro Abs 8.3 (H) 1.7 - 7.7 K/uL   Lymphocytes Relative 15 %   Lymphs Abs 1.7 0.7 - 4.0 K/uL   Monocytes Relative 8 %   Monocytes Absolute 0.9 0.1 - 1.0 K/uL   Eosinophils Relative 1 %   Eosinophils Absolute 0.1 0.0 - 0.5 K/uL   Basophils Relative 0 %   Basophils Absolute 0.1 0.0 - 0.1 K/uL   Immature Granulocytes 1 %   Abs Immature Granulocytes 0.07 0.00 - 0.07 K/uL    Comment: Performed at [facility], [address] [place], [place] [address]  Comprehensive metabolic panel     Status: Abnormal   Collection Time: 07/24/18 12:12 PM  Result Value Ref Range   Sodium 140 135 - 145 mmol/L   Potassium 3.4 (L) 3.5 - 5.1 mmol/L   Chloride 105 98 - 111 mmol/L   CO2 23 22 - 32 mmol/L   Glucose, Bld 114 (H) 70 - 99 mg/dL   BUN <5 (L) 6 - 20 mg/dL   Creatinine, Ser 5.37 0.44 - 1.00 mg/dL   Calcium 8.9 8.9 -  10.3 mg/dL   Total Protein 7.0 6.5 - 8.1 g/dL   Albumin 3.0 (L) 3.5 - 5.0 g/dL   AST 70 (H) 15 - 41 U/L   ALT 57 (H) 0 - 44 U/L   Alkaline Phosphatase 701 (H) 38 - 126 U/L   Total Bilirubin 1.4 (H) 0.3 - 1.2 mg/dL   GFR calc non Af Amer >60 >60 mL/min   GFR calc Af Amer >60 >60 mL/min   Anion gap 12 5 - 15    Comment: Performed at [facility], [address], [place], [place] [address]  Lipase, blood     Status: None   Collection Time: 07/24/18 12:12 PM  Result Value Ref Range   Lipase 30 11 - 51 U/L    Comment: Performed at [facility], [address] [place], [place] [address]  Urinalysis, Routine w reflex microscopic     Status: Abnormal   Collection Time: 07/24/18 12:28 PM  Result Value Ref Range   Color, Urine YELLOW YELLOW   APPearance CLEAR CLEAR   Specific Gravity, Urine 1.004 (L) 1.005 - 1.030   pH 6.0 5.0 - 8.0   Glucose, UA NEGATIVE NEGATIVE mg/dL   Hgb urine dipstick NEGATIVE NEGATIVE   Bilirubin Urine NEGATIVE  NEGATIVE   Ketones, ur NEGATIVE NEGATIVE mg/dL   Protein, ur NEGATIVE NEGATIVE mg/dL   Nitrite NEGATIVE NEGATIVE   Leukocytes,Ua NEGATIVE NEGATIVE    Comment: Performed at [facility], [address] [place], [place] [address]   Dg Abdomen 1 View  Result Date: 07/24/2018 CLINICAL DATA:  Abdominal pain 3-4 weeks.  Common bile duct stent. EXAM: ABDOMEN - 1 VIEW COMPARISON:  Ultrasound 07/24/2018, ERCP 06/30/2016 and CT 04/15/2009 FINDINGS: Bowel gas pattern is nonobstructive with mild fecal retention throughout the colon. 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 Remainder of the exam is unchanged. IMPRESSION: Nonobstructive bowel gas pattern. Common bile duct stent unchanged. Electronically Signed   By: Elberta Fortis M.D.   On: 07/24/2018 14:21   US Abdomen Limited Ruq  Result Date: 07/24/2018 CLINICAL DATA:  Right upper quadrant pain for several weeks EXAM: ULTRASOUND ABDOMEN LIMITED RIGHT UPPER QUADRANT COMPARISON:  06/29/2016 FINDINGS: Gallbladder: Gallbladder is decompressed with multiple gallstones within. Wall thickening measures 7.7 mm although a negative sonographic Murphy's sign is noted and overall decompression accentuates wall thickness. Common bile duct: Diameter: 2.6 mm. Liver: No focal lesion identified. Within normal limits in parenchymal echogenicity. Portal vein is patent on color Doppler imaging with normal direction of blood flow towards the liver. IMPRESSION: Multiple gallstones in a contracted gallbladder. Negative sonographic Eulah Pont sign is seen. No ductal dilatation is noted. Electronically Signed   By: Alcide Clever M.D.   On: 07/24/2018 13:32    Review of Systems  Constitutional: Negative for weight loss.  HENT: Negative for ear discharge, ear pain, hearing loss and tinnitus.   Eyes: Negative for blurred vision, double vision, photophobia and pain.  Respiratory: Negative for cough, sputum production and shortness of breath.    Cardiovascular: Negative for chest pain.  Gastrointestinal: Positive for abdominal pain. Negative for nausea and vomiting.  Genitourinary: Positive for flank pain. Negative for dysuria, frequency and urgency.  Musculoskeletal: Negative for back pain, falls, joint pain, myalgias and neck pain.  Neurological: Negative for dizziness, tingling, sensory change, focal weakness, loss of consciousness and headaches.  Endo/Heme/Allergies: Does not bruise/bleed easily.  Psychiatric/Behavioral: Negative for depression, memory loss and substance abuse. The patient is  not nervous/anxious.     Blood pressure 104/75, pulse 88, temperature 98.1 F (36.7 C), temperature source Oral, resp. rate 14, last menstrual period 07/03/2018, SpO2 100 %, unknown if currently breastfeeding. Physical Exam  WDWN in NAD Eyes:  Pupils equal, round; sclera anicteric HENT:  Oral mucosa moist; good dentition  Neck:  No masses palpated, no thyromegaly Lungs:  CTA bilaterally; normal respiratory effort CV:  Regular rate and rhythm; no murmurs; extremities well-perfused with no edema Abd:  +bowel sounds, soft, tender in RUQ, no palpable organomegaly; no palpable hernias Skin:  Warm, dry; no sign of jaundice Psychiatric - alert and oriented x 4; calm mood and affect  Assessment/Plan Acute calculus cholecystitis Remaining CBD stent  Admit for IV antibiotics Lap chole with IOC tomorrow.  The surgical procedure has been discussed with the patient.  Potential risks, benefits, alternative treatments, and expected outcomes have been explained.  All of the patient's questions at this time have been answered.  The likelihood of reaching the patient's treatment goal is good.  The patient understand the proposed surgical procedure and wishes to proceed.  Consult GI for EGD/ stent removal.      Wynona Luna, MD 07/24/2018, 3:04 PM

## 2018-07-24 NOTE — ED Triage Notes (Signed)
Per pt, she has had abdominal pain after eating for 1 week. Getting progressively worse.

## 2018-07-25 ENCOUNTER — Encounter (HOSPITAL_COMMUNITY): Payer: Self-pay | Admitting: Anesthesiology

## 2018-07-25 ENCOUNTER — Observation Stay (HOSPITAL_COMMUNITY): Payer: Medicaid Other

## 2018-07-25 ENCOUNTER — Observation Stay (HOSPITAL_COMMUNITY): Payer: Medicaid Other | Admitting: Anesthesiology

## 2018-07-25 ENCOUNTER — Encounter (HOSPITAL_COMMUNITY)
Admission: EM | Disposition: A | Discharge status: Home / Self Care | Payer: Self-pay | Source: Home / Self Care | Attending: Emergency Medicine

## 2018-07-25 DIAGNOSIS — K8010 Calculus of gallbladder with chronic cholecystitis without obstruction: Secondary | ICD-10-CM | POA: Diagnosis not present

## 2018-07-25 DIAGNOSIS — T189XXA Foreign body of alimentary tract, part unspecified, initial encounter: Secondary | ICD-10-CM | POA: Diagnosis not present

## 2018-07-25 DIAGNOSIS — T85898A Other specified complication of other internal prosthetic devices, implants and grafts, initial encounter: Secondary | ICD-10-CM | POA: Diagnosis not present

## 2018-07-25 DIAGNOSIS — K828 Other specified diseases of gallbladder: Secondary | ICD-10-CM | POA: Diagnosis not present

## 2018-07-25 HISTORY — PX: CHOLECYSTECTOMY: SHX55

## 2018-07-25 HISTORY — PX: ESOPHAGOGASTRODUODENOSCOPY: SHX5428

## 2018-07-25 LAB — COMPREHENSIVE METABOLIC PANEL
ALK PHOS: 606 U/L — AB (ref 38–126)
ALT: 42 U/L (ref 0–44)
AST: 42 U/L — AB (ref 15–41)
Albumin: 2.6 g/dL — ABNORMAL LOW (ref 3.5–5.0)
Anion gap: 9 (ref 5–15)
BUN: <5 mg/dL — ABNORMAL LOW (ref 6–20)
CO2: 23 mmol/L (ref 22–32)
Calcium: 8.2 mg/dL — ABNORMAL LOW (ref 8.9–10.3)
Chloride: 110 mmol/L (ref 98–111)
Creatinine, Ser: 0.57 mg/dL (ref 0.44–1.00)
GFR calc Af Amer: >60 mL/min (ref >60)
GFR calc non Af Amer: >60 mL/min (ref >60)
Glucose, Bld: 94 mg/dL (ref 70–99)
Potassium: 3.4 mmol/L — ABNORMAL LOW (ref 3.5–5.1)
Sodium: 142 mmol/L (ref 135–145)
Total Bilirubin: 0.9 mg/dL (ref 0.3–1.2)
Total Protein: 5.9 g/dL — ABNORMAL LOW (ref 6.5–8.1)

## 2018-07-25 LAB — CBC
HCT: 38.8 % (ref 36.0–46.0)
Hemoglobin: 12.8 g/dL (ref 12.0–15.0)
MCH: 29.8 pg (ref 26.0–34.0)
MCHC: 33 g/dL (ref 30.0–36.0)
MCV: 90.2 fL (ref 80.0–100.0)
Platelets: 305 10*3/uL (ref 150–400)
RBC: 4.3 MIL/uL (ref 3.87–5.11)
RDW: 12.9 % (ref 11.5–15.5)
WBC: 6.9 10*3/uL (ref 4.0–10.5)
nRBC: 0 % (ref 0.0–0.2)

## 2018-07-25 LAB — HIV ANTIBODY (ROUTINE TESTING W REFLEX): HIV Screen 4th Generation wRfx: NONREACTIVE

## 2018-07-25 SURGERY — LAPAROSCOPIC CHOLECYSTECTOMY WITH INTRAOPERATIVE CHOLANGIOGRAM
Anesthesia: General | Site: Mouth

## 2018-07-25 MED ORDER — MIDAZOLAM HCL 2 MG/2ML IJ SOLN
INTRAMUSCULAR | Status: AC
  Filled 2018-07-25: qty 2

## 2018-07-25 MED ORDER — PROMETHAZINE HCL 25 MG/ML IJ SOLN: 6.2500 mg | INTRAMUSCULAR | Status: DC | PRN

## 2018-07-25 MED ORDER — OXYCODONE HCL 5 MG PO TABS: 5.0000 mg | ORAL_TABLET | ORAL | Status: DC | PRN

## 2018-07-25 MED ORDER — SODIUM CHLORIDE 0.9 % IR SOLN
Status: DC | PRN
  Administered 2018-07-25: 1

## 2018-07-25 MED ORDER — LIDOCAINE 2% (20 MG/ML) 5 ML SYRINGE
INTRAMUSCULAR | Status: AC
  Filled 2018-07-25: qty 5

## 2018-07-25 MED ORDER — IOPAMIDOL (ISOVUE-300) INJECTION 61%
INTRAVENOUS | Status: AC
  Filled 2018-07-25: qty 50

## 2018-07-25 MED ORDER — PHENYLEPHRINE 40 MCG/ML (10ML) SYRINGE FOR IV PUSH (FOR BLOOD PRESSURE SUPPORT)
PREFILLED_SYRINGE | INTRAVENOUS | Status: AC
  Filled 2018-07-25: qty 10

## 2018-07-25 MED ORDER — ROCURONIUM BROMIDE 50 MG/5ML IV SOSY
PREFILLED_SYRINGE | INTRAVENOUS | Status: DC | PRN
  Administered 2018-07-25: 50 mg via INTRAVENOUS

## 2018-07-25 MED ORDER — 0.9 % SODIUM CHLORIDE (POUR BTL) OPTIME
TOPICAL | Status: DC | PRN
  Administered 2018-07-25: 1000 mL

## 2018-07-25 MED ORDER — SCOPOLAMINE 1 MG/3DAYS TD PT72
MEDICATED_PATCH | TRANSDERMAL | Status: AC
  Filled 2018-07-25: qty 1

## 2018-07-25 MED ORDER — FENTANYL CITRATE (PF) 250 MCG/5ML IJ SOLN
INTRAMUSCULAR | Status: AC
  Filled 2018-07-25: qty 5

## 2018-07-25 MED ORDER — IBUPROFEN 600 MG PO TABS: 600.0000 mg | ORAL_TABLET | Freq: Four times a day (QID) | ORAL | Status: DC | PRN

## 2018-07-25 MED ORDER — ONDANSETRON HCL 4 MG/2ML IJ SOLN
INTRAMUSCULAR | Status: AC
  Filled 2018-07-25: qty 6

## 2018-07-25 MED ORDER — DEXAMETHASONE SODIUM PHOSPHATE 10 MG/ML IJ SOLN
INTRAMUSCULAR | Status: DC | PRN
  Administered 2018-07-25: 10 mg via INTRAVENOUS

## 2018-07-25 MED ORDER — MIDAZOLAM HCL 5 MG/5ML IJ SOLN
INTRAMUSCULAR | Status: DC | PRN
  Administered 2018-07-25: 2 mg via INTRAVENOUS

## 2018-07-25 MED ORDER — PHENYLEPHRINE HCL 10 MG/ML IJ SOLN
INTRAMUSCULAR | Status: DC | PRN
  Administered 2018-07-25: 80 ug via INTRAVENOUS

## 2018-07-25 MED ORDER — LIDOCAINE 2% (20 MG/ML) 5 ML SYRINGE
INTRAMUSCULAR | Status: DC | PRN
  Administered 2018-07-25: 60 mg via INTRAVENOUS

## 2018-07-25 MED ORDER — FENTANYL CITRATE (PF) 100 MCG/2ML IJ SOLN: 25.0000 ug | INTRAMUSCULAR | Status: DC | PRN

## 2018-07-25 MED ORDER — LACTATED RINGERS IV SOLN: INTRAVENOUS | Status: DC

## 2018-07-25 MED ORDER — ACETAMINOPHEN 10 MG/ML IV SOLN: 1000.0000 mg | Freq: Once | INTRAVENOUS | Status: DC | PRN

## 2018-07-25 MED ORDER — SCOPOLAMINE 1 MG/3DAYS TD PT72: 1.0000 | MEDICATED_PATCH | Freq: Once | TRANSDERMAL | Status: DC

## 2018-07-25 MED ORDER — MORPHINE SULFATE (PF) 2 MG/ML IV SOLN: 2.0000 mg | INTRAVENOUS | Status: DC | PRN

## 2018-07-25 MED ORDER — LACTATED RINGERS IV SOLN
INTRAVENOUS | Status: DC | PRN
  Administered 2018-07-25 (×2): via INTRAVENOUS

## 2018-07-25 MED ORDER — BUPIVACAINE HCL (PF) 0.25 % IJ SOLN
INTRAMUSCULAR | Status: AC
  Filled 2018-07-25: qty 30

## 2018-07-25 MED ORDER — PROPOFOL 10 MG/ML IV BOLUS
INTRAVENOUS | Status: DC | PRN
  Administered 2018-07-25: 150 mg via INTRAVENOUS

## 2018-07-25 MED ORDER — ACETAMINOPHEN 500 MG PO TABS: ORAL_TABLET | ORAL | 0 refills | Status: AC

## 2018-07-25 MED ORDER — KETOROLAC TROMETHAMINE 30 MG/ML IJ SOLN
INTRAMUSCULAR | Status: DC | PRN
  Administered 2018-07-25: 30 mg via INTRAVENOUS

## 2018-07-25 MED ORDER — OXYCODONE HCL 5 MG PO TABS: 5.0000 mg | ORAL_TABLET | Freq: Once | ORAL | Status: DC | PRN

## 2018-07-25 MED ORDER — BUPIVACAINE-EPINEPHRINE 0.25% -1:200000 IJ SOLN
INTRAMUSCULAR | Status: DC | PRN
  Administered 2018-07-25: 10 mL

## 2018-07-25 MED ORDER — SUGAMMADEX SODIUM 200 MG/2ML IV SOLN
INTRAVENOUS | Status: DC | PRN
  Administered 2018-07-25: 150 mg via INTRAVENOUS

## 2018-07-25 MED ORDER — IBUPROFEN 200 MG PO TABS: ORAL_TABLET | ORAL | 0 refills | Status: AC

## 2018-07-25 MED ORDER — ACETAMINOPHEN 500 MG PO TABS: 1000.0000 mg | ORAL_TABLET | Freq: Three times a day (TID) | ORAL | Status: DC

## 2018-07-25 MED ORDER — FENTANYL CITRATE (PF) 100 MCG/2ML IJ SOLN
INTRAMUSCULAR | Status: DC | PRN
  Administered 2018-07-25 (×2): 50 ug via INTRAVENOUS
  Administered 2018-07-25 (×2): 25 ug via INTRAVENOUS
  Administered 2018-07-25: 100 ug via INTRAVENOUS

## 2018-07-25 MED ORDER — EPHEDRINE 5 MG/ML INJ
INTRAVENOUS | Status: AC
  Filled 2018-07-25: qty 10

## 2018-07-25 MED ORDER — SODIUM CHLORIDE 0.9 % IV SOLN
INTRAVENOUS | Status: DC | PRN
  Administered 2018-07-25: 10 mL

## 2018-07-25 MED ORDER — DEXAMETHASONE SODIUM PHOSPHATE 10 MG/ML IJ SOLN
INTRAMUSCULAR | Status: AC
  Filled 2018-07-25: qty 3

## 2018-07-25 MED ORDER — OXYCODONE HCL 5 MG/5ML PO SOLN: 5.0000 mg | Freq: Once | ORAL | Status: DC | PRN

## 2018-07-25 MED ORDER — SCOPOLAMINE 1 MG/3DAYS TD PT72
MEDICATED_PATCH | TRANSDERMAL | Status: DC | PRN
  Administered 2018-07-25: 1 via TRANSDERMAL

## 2018-07-25 MED ORDER — PROPOFOL 10 MG/ML IV BOLUS
INTRAVENOUS | Status: AC
  Filled 2018-07-25: qty 20

## 2018-07-25 SURGICAL SUPPLY — 50 items
APL SKNCLS STERI-STRIP NONHPOA (GAUZE/BANDAGES/DRESSINGS) ×2
APPLIER CLIP ROT 10 11.4 M/L (STAPLE) ×4
APR CLP MED LRG 11.4X10 (STAPLE) ×2
BAG SPEC RTRVL 10 TROC 200 (ENDOMECHANICALS)
BAG SPEC RTRVL LRG 6X4 10 (ENDOMECHANICALS)
BENZOIN TINCTURE PRP APPL 2/3 (GAUZE/BANDAGES/DRESSINGS) ×4 IMPLANT
BLADE CLIPPER SURG (BLADE) IMPLANT
CANISTER SUCT 3000ML PPV (MISCELLANEOUS) ×4 IMPLANT
CHLORAPREP W/TINT 26ML (MISCELLANEOUS) ×4 IMPLANT
CLIP APPLIE ROT 10 11.4 M/L (STAPLE) ×2 IMPLANT
CLOSURE WOUND 1/2 X4 (GAUZE/BANDAGES/DRESSINGS) ×1
COVER MAYO STAND STRL (DRAPES) ×4 IMPLANT
COVER SURGICAL LIGHT HANDLE (MISCELLANEOUS) ×4 IMPLANT
COVER WAND RF STERILE (DRAPES) ×4 IMPLANT
DRAPE C-ARM 42X72 X-RAY (DRAPES) ×4 IMPLANT
DRSG TEGADERM 2-3/8X2-3/4 SM (GAUZE/BANDAGES/DRESSINGS) ×12 IMPLANT
DRSG TEGADERM 4X4.75 (GAUZE/BANDAGES/DRESSINGS) ×4 IMPLANT
ELECT REM PT RETURN 9FT ADLT (ELECTROSURGICAL) ×4
ELECTRODE REM PT RTRN 9FT ADLT (ELECTROSURGICAL) ×2 IMPLANT
ENDOLOOP SUT PDS II  0 18 (SUTURE) ×4
ENDOLOOP SUT PDS II 0 18 (SUTURE) IMPLANT
GAUZE SPONGE 2X2 8PLY STRL LF (GAUZE/BANDAGES/DRESSINGS) ×2 IMPLANT
GLOVE BIO SURGEON STRL SZ7 (GLOVE) ×4 IMPLANT
GLOVE BIOGEL PI IND STRL 7.5 (GLOVE) ×2 IMPLANT
GLOVE BIOGEL PI INDICATOR 7.5 (GLOVE) ×2
GOWN STRL REUS W/ TWL LRG LVL3 (GOWN DISPOSABLE) ×6 IMPLANT
GOWN STRL REUS W/TWL LRG LVL3 (GOWN DISPOSABLE) ×16
KIT BASIN OR (CUSTOM PROCEDURE TRAY) ×4 IMPLANT
KIT TURNOVER KIT B (KITS) ×4 IMPLANT
NS IRRIG 1000ML POUR BTL (IV SOLUTION) ×4 IMPLANT
PAD ARMBOARD 7.5X6 YLW CONV (MISCELLANEOUS) ×4 IMPLANT
POUCH RETRIEVAL ECOSAC 10 (ENDOMECHANICALS) IMPLANT
POUCH RETRIEVAL ECOSAC 10MM (ENDOMECHANICALS)
POUCH SPECIMEN RETRIEVAL 10MM (ENDOMECHANICALS) IMPLANT
SCISSORS LAP 5X35 DISP (ENDOMECHANICALS) ×4 IMPLANT
SET CHOLANGIOGRAPH 5 50 .035 (SET/KITS/TRAYS/PACK) ×4 IMPLANT
SET IRRIG TUBING LAPAROSCOPIC (IRRIGATION / IRRIGATOR) ×4 IMPLANT
SET TUBE SMOKE EVAC HIGH FLOW (TUBING) ×4 IMPLANT
SLEEVE ENDOPATH XCEL 5M (ENDOMECHANICALS) ×4 IMPLANT
SPECIMEN JAR SMALL (MISCELLANEOUS) ×4 IMPLANT
SPONGE GAUZE 2X2 STER 10/PKG (GAUZE/BANDAGES/DRESSINGS) ×2
STRIP CLOSURE SKIN 1/2X4 (GAUZE/BANDAGES/DRESSINGS) ×3 IMPLANT
SUT MNCRL AB 4-0 PS2 18 (SUTURE) ×4 IMPLANT
TOWEL OR 17X24 6PK STRL BLUE (TOWEL DISPOSABLE) ×4 IMPLANT
TOWEL OR 17X26 10 PK STRL BLUE (TOWEL DISPOSABLE) ×4 IMPLANT
TRAY LAPAROSCOPIC MC (CUSTOM PROCEDURE TRAY) ×4 IMPLANT
TROCAR XCEL BLUNT TIP 100MML (ENDOMECHANICALS) ×4 IMPLANT
TROCAR XCEL NON-BLD 11X100MML (ENDOMECHANICALS) ×4 IMPLANT
TROCAR XCEL NON-BLD 5MMX100MML (ENDOMECHANICALS) ×4 IMPLANT
WATER STERILE IRR 1000ML POUR (IV SOLUTION) ×4 IMPLANT

## 2018-07-25 NOTE — Progress Notes (Signed)
    CC: Abdominal pain  Subjective: Anxious to have surgery and go home after surgery today.  No complaints of abdominal pain this morning.  Objective: Vital signs in last 24 hours: Temp:  [98.1 F (36.7 C)-98.5 F (36.9 C)] 98.5 F (36.9 C) (03/03 0700) Pulse Rate:  [64-89] 73 (03/03 0700) Resp:  [14-18] 18 (03/03 0700) BP: (94-114)/(70-77) 94/74 (03/03 0700) SpO2:  [97 %-100 %] 97 % (03/03 0700) Last BM Date: 07/21/18 N.p.o.  1075 IV No output recorded Afebrile vital signs are stable Labs show improvement in LFTs.   WBC down to 6.9.   Intake/Output from previous day: 03/02 0701 - 03/03 0700 In: 1075.5 [I.V.:1025.5; IV Piggyback:50] Out: -  Intake/Output this shift: No intake/output data recorded.  General appearance: alert, cooperative, no distress and She is washing down with wipes prior to surgery no complaints of abdominal discomfort this morning.  Lab Results:  Recent Labs    07/24/18 1212 07/25/18 0201  WBC 11.2* 6.9  HGB 13.5 12.8  HCT 42.8 38.8  PLT 351 305    BMET Recent Labs    07/24/18 1212 07/25/18 0201  NA 140 142  K 3.4* 3.4*  CL 105 110  CO2 23 23  GLUCOSE 114* 94  BUN <5* <5*  CREATININE 0.66 0.57  CALCIUM 8.9 8.2*   PT/INR No results for input(s): LABPROT, INR in the last 72 hours.  Recent Labs  Lab 07/24/18 1212 07/25/18 0201  AST 70* 42*  ALT 57* 42  ALKPHOS 701* 606*  BILITOT 1.4* 0.9  PROT 7.0 5.9*  ALBUMIN 3.0* 2.6*     Lipase     Component Value Date/Time   LIPASE 30 07/24/2018 1212   Prior to Admission medications   Medication Sig Start Date End Date Taking? Authorizing Provider  Cholecalciferol (VITAMIN D3 PO) Take 10,000 Units by mouth daily.   Yes [provider]  ibuprofen (ADVIL,MOTRIN) 200 MG tablet Take 800 mg by mouth every 6 (six) hours as needed for headache or moderate pain.   Yes [provider]  dicyclomine (BENTYL) 20 MG tablet Take 1 tablet (20 mg total) by mouth 2 (two)  times daily. Patient not taking: Reported on 07/24/2018 03/19/16   Rolland Porter, MD  ondansetron (ZOFRAN) 4 MG tablet Take 1 tablet (4 mg total) by mouth every 6 (six) hours. Patient not taking: Reported on 07/24/2018 03/19/16   Rolland Porter, MD      Medications: .  morphine injection  4 mg Intravenous Once  . ondansetron (ZOFRAN) IV  4 mg Intravenous Once   . sodium chloride 100 mL/hr at 07/25/18 0253  . cefTRIAXone (ROCEPHIN)  IV     Anti-infectives (From admission, onward)   Start     Dose/Rate Route Frequency Ordered Stop   07/25/18 1400  cefTRIAXone (ROCEPHIN) 2 g in sodium chloride 0.9 % 100 mL IVPB     2 g 200 mL/hr over 30 Minutes Intravenous Every 24 hours 07/24/18 1611     07/24/18 1400  cefTRIAXone (ROCEPHIN) 2 g in sodium chloride 0.9 % 100 mL IVPB     2 g 200 mL/hr over 30 Minutes Intravenous  Once 07/24/18 1352 07/24/18 1508      Assessment/Plan  Acute calculus cholecystitis Remaining CBD stent  FEN: IV fluids/n.p.o. ID: Rocephin 3/2>> day 2 DVT: SCDs Follow-up: [facility]  Plan: Stent removal and laparoscopic cholecystectomy later this a.m.  LOS: 0 days    Amanda Brandt 07/25/2018 [phone]

## 2018-07-25 NOTE — Progress Notes (Signed)
Returned to room at this time from PACU, Ambulated to bathroom, walking around in room, denies nausea, c/o "slight" discomfort

## 2018-07-25 NOTE — Interval H&P Note (Signed)
History and Physical Interval Note:  07/25/2018 8:53 AM  Amanda Brandt  has presented today for surgery, with the diagnosis of cholelithiasis, cholecystitis  The various methods of treatment have been discussed with the patient and family. After consideration of risks, benefits and other options for treatment, the patient has consented to  Procedure(s): LAPAROSCOPIC CHOLECYSTECTOMY WITH INTRAOPERATIVE CHOLANGIOGRAM, and common bile duct stent removal with Endoscopy (N/A) ESOPHAGOGASTRODUODENOSCOPY (EGD) WITH STENT REMOVAL (N/A) as a surgical intervention .  The patient's history has been reviewed, patient examined, no change in status, stable for surgery.  I have reviewed the patient's chart and labs.  Questions were answered to the patient's satisfaction.     Amanda Brandt  Assessment:  1.  Bile duct stent in place. 2.  Symptomatic cholelithiasis.  Plan:  1.  Endoscopy with foreign body removal with Dr. Dulce Sellar. 2.  Risks (bleeding, infection, bowel perforation that could require surgery, sedation-related changes in cardiopulmonary systems), benefits (identification and possible treatment of source of symptoms, exclusion of certain causes of symptoms), and alternatives (watchful waiting, radiographic imaging studies, empiric medical treatment) of upper endoscopy (EGD) were explained to patient/family in detail and patient wishes to proceed. 3.  Cholecystectomy to follow with Dr. Corliss Skains.

## 2018-07-25 NOTE — Final Progress Note (Signed)
Pt refused urine preg test. Dr Stephannie Peters aware

## 2018-07-25 NOTE — Op Note (Signed)
Laparoscopic Cholecystectomy with IOC Procedure Note  Indications: This is a 38 yo female who presents with a one week history of RUQ abdominal pain, worse with eating any type of food.  She reports no nausea or vomiting, no diarrhea, no fever.  The pain radiates to her back.    In 2018, she was hospitalized at [facility] with cholecholithiasis and jaundice.  She underwent ERCP with stent placement and was discharged with instructions to follow-up with GI for stent removal and with General Surgery for cholecystectomy.  She failed to do either.  She presents now for cholecystectomy and stent removal  Pre-operative Diagnosis: Calculus of gallbladder with acute cholecystitis, without mention of obstruction; indwelling biliary stent  Post-operative Diagnosis: Same  Surgeon: Wynona Luna   Assistants: none  Anesthesia: General endotracheal anesthesia  ASA Class: 2  Procedure Details  The patient was seen again in the Holding Room. The risks, benefits, complications, treatment options, and expected outcomes were discussed with the patient. The possibilities of reaction to medication, pulmonary aspiration, perforation of viscus, bleeding, recurrent infection, finding a normal gallbladder, the need for additional procedures, failure to diagnose a condition, the possible need to convert to an open procedure, and creating a complication requiring transfusion or operation were discussed with the patient. The likelihood of improving the patient's symptoms with return to their baseline status is good.  The patient and/or family concurred with the proposed plan, giving informed consent. The site of surgery properly noted. The patient was taken to Operating Room, identified as Amanda Brandt and the procedure verified as Laparoscopic Cholecystectomy with Intraoperative Cholangiogram.  Prior to my procedure, Dr. Dulce Sellar performed endoscopy and removed her previous biliary stent.  A Time Out was  held and the above information confirmed.  Prior to the induction of general anesthesia, antibiotic prophylaxis was administered. General endotracheal anesthesia was then administered and tolerated well. After the induction, the abdomen was prepped with Chloraprep and draped in the sterile fashion. The patient was positioned in the supine position.  Local anesthetic agent was injected into the skin near the umbilicus and an incision made. We dissected down to the abdominal fascia with blunt dissection.  The fascia was incised vertically and we entered the peritoneal cavity bluntly.  A pursestring suture of 0-Vicryl was placed around the fascial opening.  The Hasson cannula was inserted and secured with the stay suture.  Pneumoperitoneum was then created with CO2 and tolerated well without any adverse changes in the patient's vital signs. An 11-mm port was placed in the subxiphoid position.  Two 5-mm ports were placed in the right upper quadrant. All skin incisions were infiltrated with a local anesthetic agent before making the incision and placing the trocars.   We positioned the patient in reverse Trendelenburg, tilted slightly to the patient's left.  The gallbladder was identified, the fundus grasped and retracted cephalad.  The gallbladder is chronically thickened. Adhesions were lysed bluntly and with the electrocautery where indicated, taking care not to injure any adjacent organs or viscus. The infundibulum was grasped and retracted laterally, exposing the peritoneum overlying the triangle of Calot. This was then divided and exposed in a blunt fashion. A critical view of the cystic duct and cystic artery was obtained.  The cystic duct was clearly identified and bluntly dissected circumferentially. The cystic duct was ligated with a clip distally.  The cystic duct is chronically enlarged, so the clip did not reach all of the way across.   An incision was made  in the cystic duct and the [facility] cholangiogram  catheter introduced. The catheter was secured using a clip. A cholangiogram was then obtained which showed good visualization of the distal and proximal biliary tree with no sign of filling defects or obstruction.  Contrast flowed easily into the duodenum. The catheter was then removed.   The cystic duct was then ligated with Endoloops and divided. The cystic artery was identified, dissected free, ligated with clips and divided as well.   The gallbladder was dissected from the liver bed in retrograde fashion with the electrocautery. The gallbladder was removed and placed in an Eco sac. The liver bed was irrigated and inspected. Hemostasis was achieved with the electrocautery. Copious irrigation was utilized and was repeatedly aspirated until clear.  The gallbladder and Eco sac were then removed through the umbilical port site.  The pursestring suture was used to close the umbilical fascia.    We again inspected the right upper quadrant for hemostasis.  Pneumoperitoneum was released as we removed the trocars.  4-0 Monocryl was used to close the skin.   Benzoin, steri-strips, and clean dressings were applied. The patient was then extubated and brought to the recovery room in stable condition. Instrument, sponge, and needle counts were correct at closure and at the conclusion of the case.   Findings: Cholecystitis with Cholelithiasis  Estimated Blood Loss: less than 50 mL         Drains: none         Specimens: Gallbladder           Complications: None; patient tolerated the procedure well.         Disposition: PACU - hemodynamically stable.         Condition: stable  Amanda Brandt. Amanda Skains, MD, [facility] Surgery  General/ Trauma Surgery Beeper [phone]  07/25/2018 10:57 AM

## 2018-07-25 NOTE — Progress Notes (Signed)
Feels good after surgery, does not want any narcotics for pain.  I offered her plain codeine and she prefers to use Tylenol/ibuprofen. She has not taken anything so far.  Waiting to eat.  She wants to go home after she eats, DC instructions reviewed with her.  I will set her up to go home after lunch.  She is going to have nursing call if any issues occur, or if she changes her mind prior to discharge.

## 2018-07-25 NOTE — Anesthesia Procedure Notes (Signed)
Procedure Name: Intubation Date/Time: 07/25/2018 9:15 AM Performed by: Neldon Newport, CRNA Pre-anesthesia Checklist: Timeout performed, Patient being monitored, Suction available, Emergency Drugs available and Patient identified Patient Re-evaluated:Patient Re-evaluated prior to induction Oxygen Delivery Method: Circle system utilized Preoxygenation: Pre-oxygenation with 100% oxygen Induction Type: IV induction Ventilation: Mask ventilation without difficulty Laryngoscope Size: Mac and 3 Grade View: Grade I Tube type: Oral Tube size: 7.0 mm Number of attempts: 1 Placement Confirmation: ETT inserted through vocal cords under direct vision,  positive ETCO2 and breath sounds checked- equal and bilateral Secured at: 21 cm Tube secured with: Tape Dental Injury: Teeth and Oropharynx as per pre-operative assessment

## 2018-07-25 NOTE — Discharge Instructions (Signed)
[facility] ______[facility] °LAPAROSCOPIC SURGERY: POST OP INSTRUCTIONS °Always review your discharge instruction sheet given to you by the facility where your surgery was performed. °IF YOU HAVE DISABILITY OR FAMILY LEAVE FORMS, YOU MUST BRING THEM TO THE OFFICE FOR PROCESSING.   °DO NOT GIVE THEM TO YOUR DOCTOR. ° °1. A prescription for pain medication may be given to you upon discharge.  Take your pain medication as prescribed, if needed.  If narcotic pain medicine is not needed, then you may take acetaminophen (Tylenol) or ibuprofen (Advil) as needed. °2. Take your usually prescribed medications unless otherwise directed. °3. If you need a refill on your pain medication, please contact your pharmacy.  They will contact our office to request authorization. Prescriptions will not be filled after 5pm or on week-ends. °4. You should follow a light diet the first few days after arrival home, such as soup and crackers, etc.  Be sure to include lots of fluids daily. °5. Most patients will experience some swelling and bruising in the area of the incisions.  Ice packs will help.  Swelling and bruising can take several days to resolve.  °6. It is common to experience some constipation if taking pain medication after surgery.  Increasing fluid intake and taking a stool softener (such as Colace) will usually help or prevent this problem from occurring.  A mild laxative (Milk of Magnesia or Miralax) should be taken according to package instructions if there are no bowel movements after 48 hours. °7. Unless discharge instructions indicate otherwise, you may remove your bandages 24-48 hours after surgery, and you may shower at that time.  You may have steri-strips (small skin tapes) in place directly over the incision.  These strips should be left on the skin for 7-10 days.  If your surgeon used skin glue on the incision, you may shower in 24 hours.  The glue will flake off over the next 2-3 weeks.  Any sutures or  staples will be removed at the office during your follow-up visit. °8. ACTIVITIES:  You may resume regular (light) daily activities beginning the next day--such as daily self-care, walking, climbing stairs--gradually increasing activities as tolerated.  You may have sexual intercourse when it is comfortable.  Refrain from any heavy lifting or straining until approved by your doctor. °a. You may drive when you are no longer taking prescription pain medication, you can comfortably wear a seatbelt, and you can safely maneuver your car and apply brakes. °b. RETURN TO WORK:  __________________________________________________________ °9. You should see your doctor in the office for a follow-up appointment approximately 2-3 weeks after your surgery.  Make sure that you call for this appointment within a day or two after you arrive home to insure a convenient appointment time. °10. OTHER INSTRUCTIONS: __________________________________________________________________________________________________________________________ __________________________________________________________________________________________________________________________ °WHEN TO CALL YOUR DOCTOR: °1. Fever over 101.0 °2. Inability to urinate °3. Continued bleeding from incision. °4. Increased pain, redness, or drainage from the incision. °5. Increasing abdominal pain ° °The clinic staff is available to answer your questions during regular business hours.  Please don’t hesitate to call and ask to speak to one of the nurses for clinical concerns.  If you have a medical emergency, go to the nearest emergency room or call 911.  A surgeon from [facility] is always on call at the hospital. °[address], [address], [place], [place]  [address] ? [address] [address], [place], [place]   [address] °[phone] ? [phone] ? FAX [phone] °Web site:   [email or web address] °

## 2018-07-25 NOTE — Anesthesia Postprocedure Evaluation (Signed)
Anesthesia Post Note  Patient: Barrister's clerk  Procedure(s) Performed: LAPAROSCOPIC CHOLECYSTECTOMY WITH INTRAOPERATIVE CHOLANGIOGRAM (N/A Abdomen) ESOPHAGOGASTRODUODENOSCOPY (EGD) WITH STENT REMOVAL (N/A Mouth)     Patient location during evaluation: PACU Anesthesia Type: General Level of consciousness: awake and alert Pain management: pain level controlled Vital Signs Assessment: post-procedure vital signs reviewed and stable Respiratory status: spontaneous breathing, nonlabored ventilation and respiratory function stable Cardiovascular status: blood pressure returned to baseline and stable Postop Assessment: no apparent nausea or vomiting Anesthetic complications: no    Last Vitals:  Vitals:   07/25/18 1150 07/25/18 1208  BP: 112/74 111/80  Pulse: 80 81  Resp: 19 12  Temp:  36.4 C  SpO2: 95% 99%    Last Pain:  Vitals:   07/25/18 1208  TempSrc:   PainSc: 0-No pain                 Kaylyn Layer

## 2018-07-25 NOTE — Transfer of Care (Signed)
Immediate Anesthesia Transfer of Care Note  Patient: Barrister's clerk  Procedure(s) Performed: LAPAROSCOPIC CHOLECYSTECTOMY WITH INTRAOPERATIVE CHOLANGIOGRAM (N/A Abdomen) ESOPHAGOGASTRODUODENOSCOPY (EGD) WITH STENT REMOVAL (N/A Mouth)  Patient Location: PACU  Anesthesia Type:General  Level of Consciousness: awake, alert  and oriented  Airway & Oxygen Therapy: Patient Spontanous Breathing and Patient connected to nasal cannula oxygen  Post-op Assessment: Report given to RN, Post -op Vital signs reviewed and stable and Patient moving all extremities X 4  Post vital signs: Reviewed and stable  Last Vitals:  Vitals Value Taken Time  BP 104/82 07/25/2018 11:07 AM  Temp    Pulse 82 07/25/2018 11:09 AM  Resp 12 07/25/2018 11:09 AM  SpO2 100 % 07/25/2018 11:09 AM  Vitals shown include unvalidated device data.  Last Pain:  Vitals:   07/25/18 0715  TempSrc:   PainSc: Asleep         Complications: No apparent anesthesia complications

## 2018-07-26 ENCOUNTER — Encounter (HOSPITAL_COMMUNITY): Payer: Self-pay | Admitting: Surgery

## 2018-07-26 NOTE — Op Note (Signed)
[facility] Patient Name: Amanda Brandt Procedure Date : 07/25/2018 MRN: [id number] Attending MD: Willis Modena , MD Date of Birth: Mar 01, 1981 CSN: [id number] Age: 38 Admit Type: Inpatient Procedure:                Upper GI endoscopy Indications:              Foreign body in the GI tract Providers:                Willis Modena, MD, Margaree Mackintosh, RN, Harrington Challenger, Technician Referring MD:             Dr. Corliss Skains ([facility]) Medicines:                General Anesthesia Complications:            No immediate complications. Estimated Blood Loss:     Estimated blood loss: none. Procedure:                Pre-Anesthesia Assessment:                           - Prior to the procedure, a History and Physical                            was performed, and patient medications and                            allergies were reviewed. The patient's tolerance of                            previous anesthesia was also reviewed. The risks                            and benefits of the procedure and the sedation                            options and risks were discussed with the patient.                            All questions were answered, and informed consent                            was obtained. Prior Anticoagulants: The patient has                            taken no previous anticoagulant or antiplatelet                            agents. ASA Grade Assessment: II - A patient with                            mild systemic disease. After reviewing the risks  and benefits, the patient was deemed in                            satisfactory condition to undergo the procedure.                           After obtaining informed consent, the endoscope was                            passed under direct vision. Throughout the                            procedure, the patient's blood pressure, pulse, and                             oxygen saturations were monitored continuously. The                            TJF-Q180V ([id number]) Olympus duodenoscope was                            introduced through the mouth, and advanced to the                            second part of duodenum. The upper GI endoscopy was                            accomplished without difficulty. The patient                            tolerated the procedure well. Scope In: Scope Out: Findings:      A previously placed double pigtail biliary stent was seen in the       ampulla. Stent removal was accomplished with a snare. Estimated blood       loss: none. Mucosal surfaces of esophagus, stomach and duodenum were not       otherwise well-examined with the side-viewing duodenoscope. Impression:               - Biliary stent in the duodenum. Removed. Moderate Sedation:      None Recommendation:           - Cholecystectomy today with Dr. Corliss Skains.                           [facility] will sign-off; happy to see patient in                            outpatient follow-up with [place]; please call with any                            questions; thank you for the consultation. Procedure Code(s):        --- Professional ---                           [phone], Esophagogastroduodenoscopy, flexible,  transoral; with removal of foreign body(s) Diagnosis Code(s):        --- Professional ---                           Z46.59, Encounter for fitting and adjustment of                            other gastrointestinal appliance and device                           T18.9XXA, Foreign body of alimentary tract, part                            unspecified, initial encounter CPT copyright 2018 [employer] Association. All rights reserved. The codes documented in this report are preliminary and upon coder review may  be revised to meet current compliance requirements. Willis Modena, MD 07/25/2018 12:01:16 PM This report has been signed  electronically. Number of Addenda: 0

## 2018-07-26 NOTE — Discharge Summary (Signed)
Physician Discharge Summary  Patient ID: Amanda Brandt MRN: [id number] DOB/AGE: Jan 10, 1981 37 y.o.  Admit date: 07/24/2018 Discharge date: 07/25/2018  Admission Diagnoses:  Acute cholecystitis/cholelithiasis Hx common bile duct stent placement  Discharge Diagnoses:  Acute calculus cholecystitis Remaining common bile duct stent   Active Problems:   Acute cholecystitis due to biliary calculus   PROCEDURES:  EGD with removal of CBD stent, Dr. Willis Modena, 07/25/2018 Laparoscopic cholecystectomy with intraoperative cholangiogram, 07/25/2018, Dr. Candice Camp Course:  This is a 38 yo female who presents with a one week history of RUQ abdominal pain, worse with eating any type of food.  She reports no nausea or vomiting, no diarrhea, no fever.  The pain radiates to her back.    In 2018, she was hospitalized at [facility] with cholecholithiasis and jaundice.  She underwent ERCP with stent placement and was discharged with instructions to follow-up with GI for stent removal and with General Surgery for cholecystectomy.  She failed to do either.  She was seen in the emergency department by Dr. Manus Rudd.  He admitted the patient.  Dr. Willis Modena was contacted and they coordinated taking her to the operating room the next day.  She underwent EGD and removal of common bile duct stent first by Dr. Dulce Sellar.  Then laparoscopic cholecystectomy with intraoperative cholangiogram by Dr. Corliss Skains.  Postop she was returned to the floor and mobilized.  She was adamant about going home that afternoon.  He deferred any narcotics postop and opts to use just plain Tylenol and ibuprofen for pain postop.  After eating that afternoon she felt ready for discharge.  Follow-up as listed below.  Addition on discharge: Improved    CBC Latest Ref Rng & Units 07/25/2018 07/24/2018 03/19/2016  WBC 4.0 - 10.5 K/uL 6.9 11.2(H) 8.9  Hemoglobin 12.0 - 15.0 g/dL 23.5 36.1 15.6(H)  Hematocrit 36.0 -  46.0 % 38.8 42.8 44.6  Platelets 150 - 400 K/uL 305 351 250   CMP Latest Ref Rng & Units 07/25/2018 07/24/2018 03/19/2016  Glucose 70 - 99 mg/dL 94 443(X) 540(G)  BUN 6 - 20 mg/dL <8(Q) <7(Y) 11  Creatinine 0.44 - 1.00 mg/dL 1.95 0.93 2.67  Sodium 135 - 145 mmol/L 142 140 140  Potassium 3.5 - 5.1 mmol/L 3.4(L) 3.4(L) 4.0  Chloride 98 - 111 mmol/L 110 105 106  CO2 22 - 32 mmol/L 23 23 25   Calcium 8.9 - 10.3 mg/dL 8.2(L) 8.9 9.4  Total Protein 6.5 - 8.1 g/dL 5.9(L) 7.0 7.4  Total Bilirubin 0.3 - 1.2 mg/dL 0.9 1.2(W) 1.2  Alkaline Phos 38 - 126 U/L 606(H) 701(H) 46  AST 15 - 41 U/L 42(H) 70(H) 25  ALT 0 - 44 U/L 42 57(H) 15    Condition on discharge: Improved   Disposition: Home   Allergies as of 07/25/2018   No Known Allergies     Medication List    TAKE these medications   acetaminophen 500 MG tablet Commonly known as:  TYLENOL You can take 1000 mg every 6 hours as needed for pain.  Do not exceed this limit.  More than 4000 mg of Tylenol(acetaminopen) can harm your liver.   dicyclomine 20 MG tablet Commonly known as:  BENTYL Take 1 tablet (20 mg total) by mouth 2 (two) times daily.   ibuprofen 200 MG tablet Commonly known as:  ADVIL,MOTRIN You can take 2-3 tablets every 6 hours as needed for pain. What changed:    how much to take  how to take this  when to take this  reasons to take this  additional instructions   ondansetron 4 MG tablet Commonly known as:  ZOFRAN Take 1 tablet (4 mg total) by mouth every 6 (six) hours.   VITAMIN D3 PO Take 10,000 Units by mouth daily.      Follow-up Information    Willis Modena, MD Follow up.   Specialty:  Gastroenterology Why:  [facility] for follow up appointment next week Contact information: [address] [place] [address] [phone]        [facility], [facility] Follow up on 08/08/2018.   Specialty:  General Surgery Why:  Your appointment is at 10: 15 AM.  Be at the office 30 minutes  early for check in.  Bring photo ID and insurance information.   Contact information: [address] [place] [address] [phone]           Signed: Sherrie George 07/26/2018, 1:14 PM

## 2018-09-07 SURGERY — ERCP, WITH INTERVENTION IF INDICATED
Anesthesia: General

## 2019-08-28 IMAGING — RF DG CHOLANGIOGRAM OPERATIVE
1 series · 5 of 5 positions shown · non-contrast
Comparison: Ultrasound 07/24/2018

CLINICAL DATA: Intraoperative cholangiogram

EXAM:
INTRAOPERATIVE CHOLANGIOGRAM
TECHNIQUE: Cholangiographic images from the C-arm fluoroscopic device were
submitted for interpretation post-operatively. Please see the
procedural report for the amount of contrast and the fluoroscopy
time utilized.

[Series 1: run · 2 acquisitions, 5 frames shown]
[im 1/2]
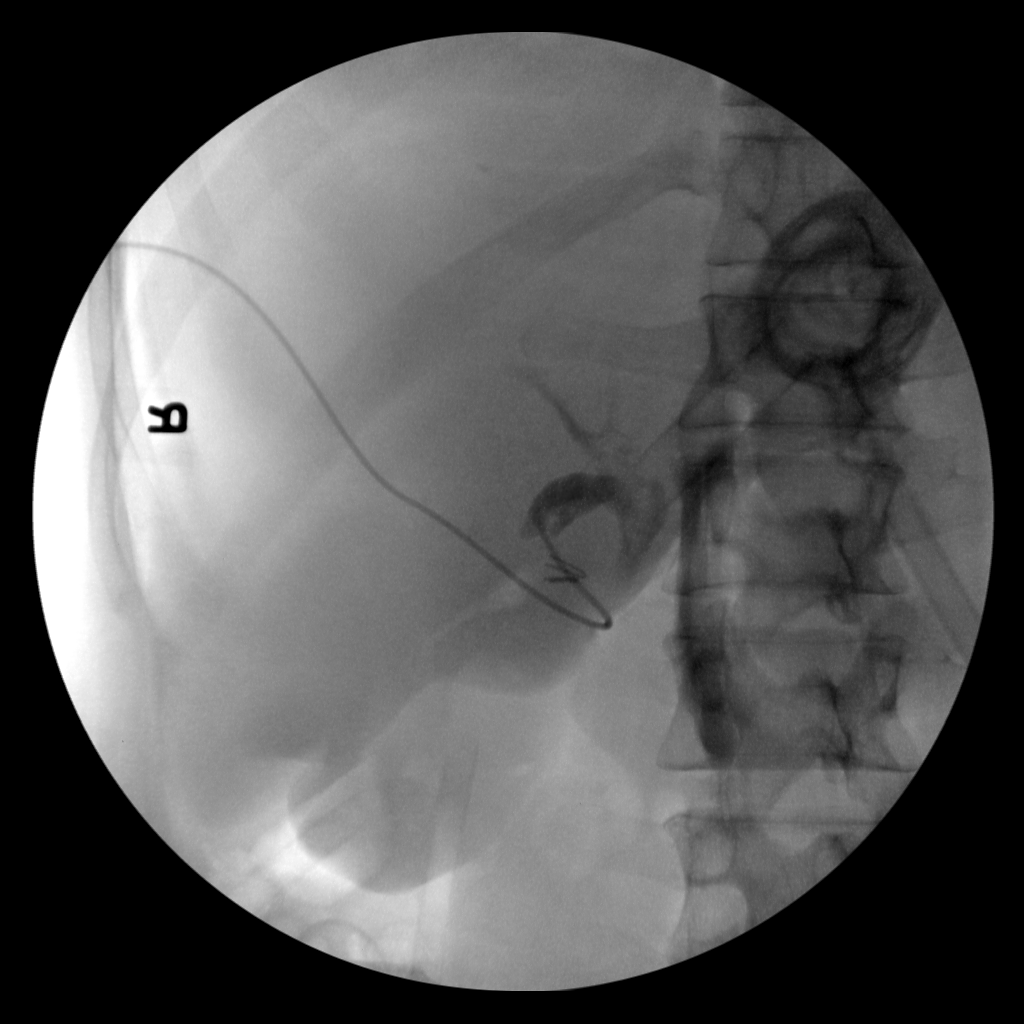
[im 1/2]
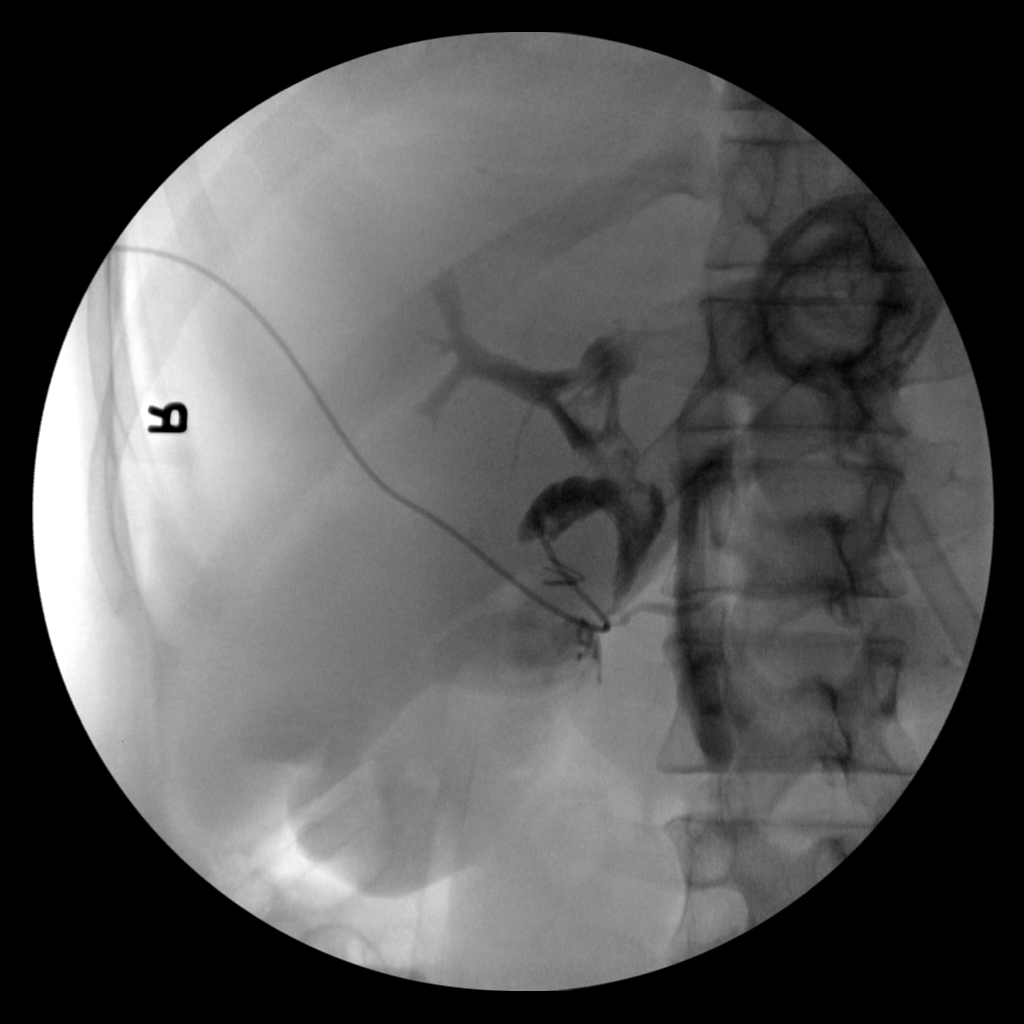
[im 1/2]
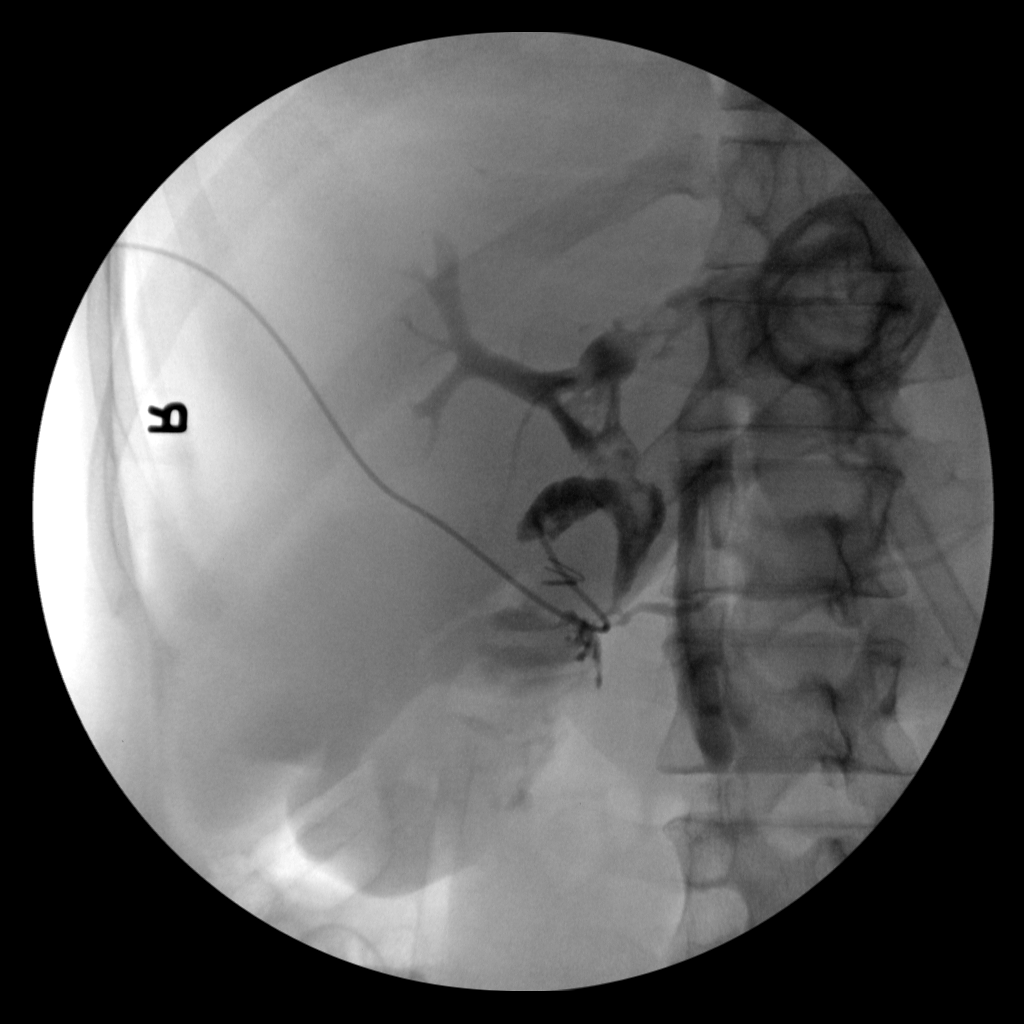
[im 1/2]
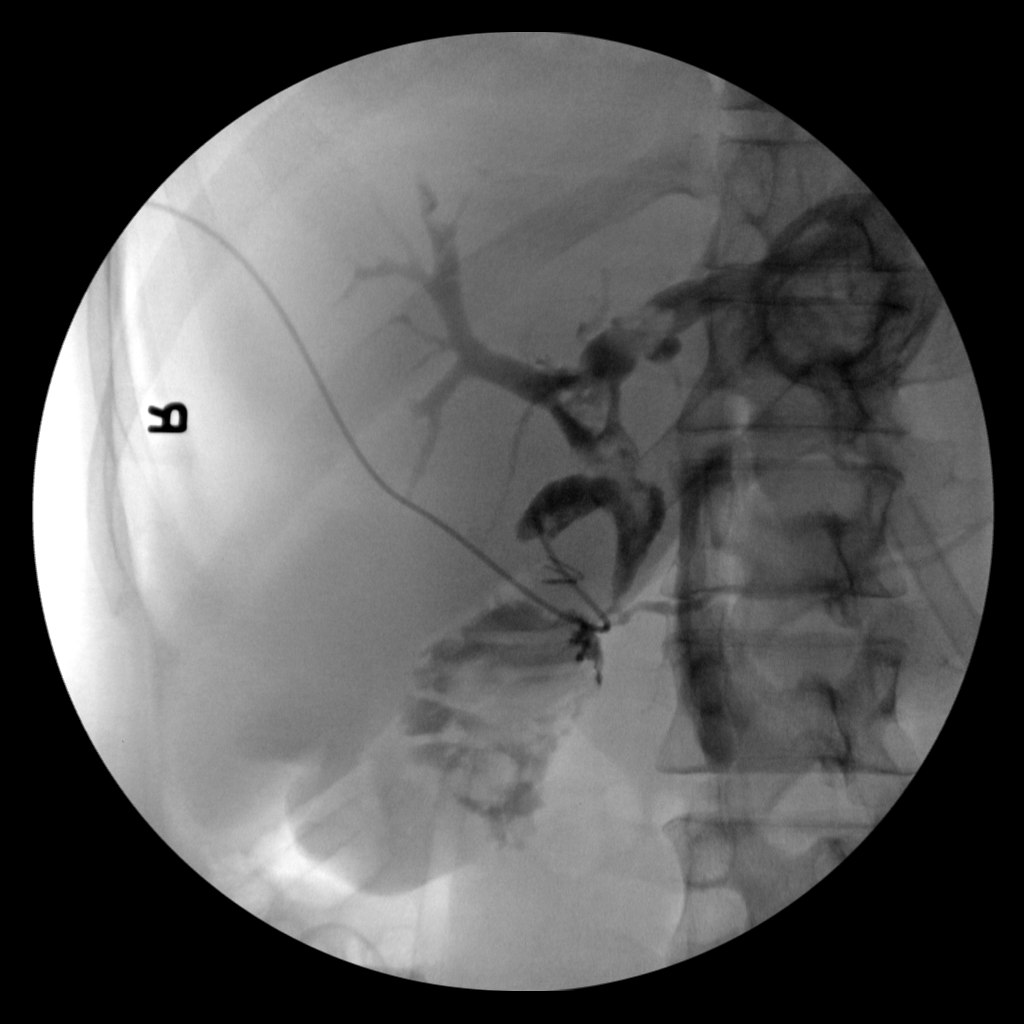
[im 2/2]
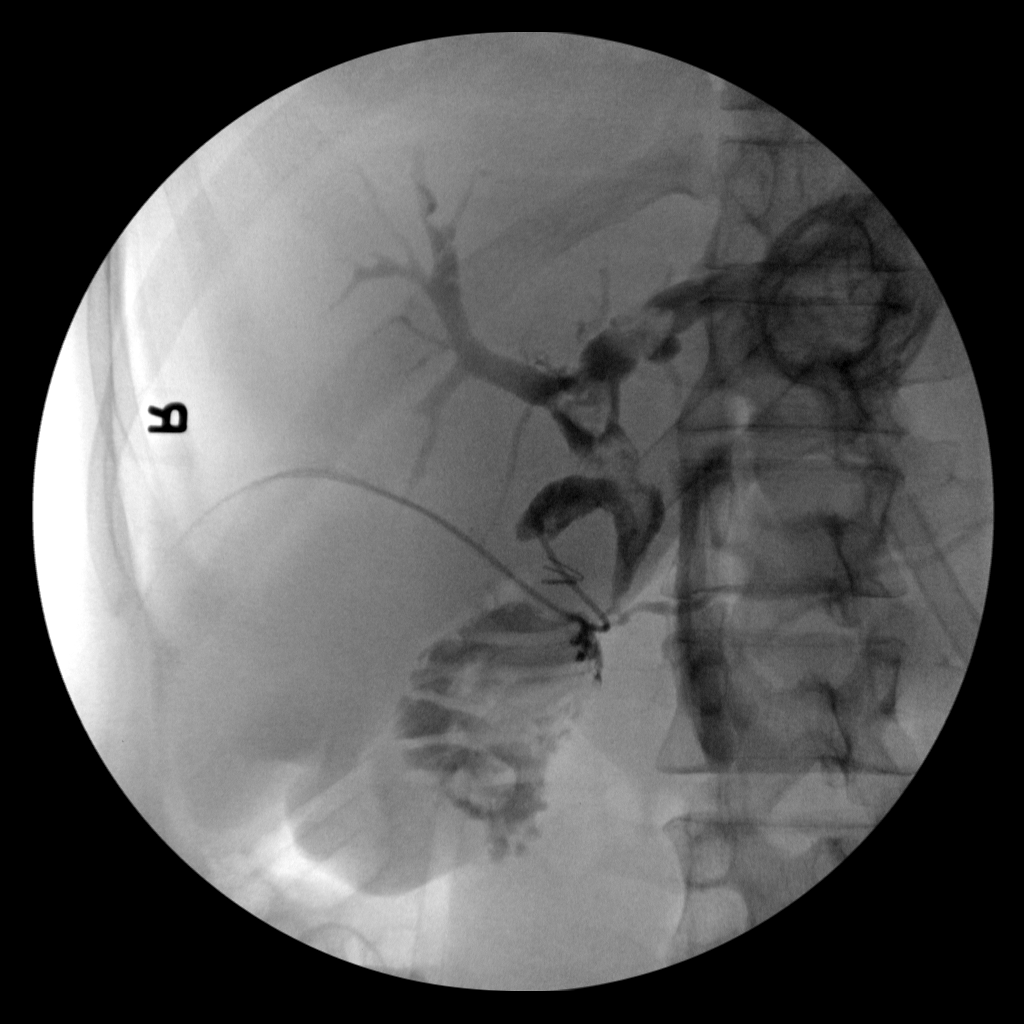

[5 of 5 positions shown; findings below may reference images not displayed]

FINDINGS: There are numerous filling defects within a dilated common hepatic
duct. Some of these appear somewhat irregular. There is also a
filling defects noted within the left hepatic duct centrally.
Intrahepatic ducts appear dilated, left greater than right. In
addition, irregular appearance of the right hepatic ducts noted.
IMPRESSION: Multiple filling defects within the common hepatic duct and the
central left hepatic duct with intrahepatic biliary ductal
dilatation, left greater than right. Right hepatic ducts also appear
somewhat irregular. This raises the possibility of sclerosing
cholangitis. Recommend further evaluation with MRI and MRCP.
